# Patient Record
Sex: Male | Born: 1960 | Race: White | Hispanic: No | Marital: Married | Smoking: Never smoker
Health system: Southern US, Community
[De-identification: ages and names within clinical notes are randomized; demographics above are authoritative.]

## PROBLEM LIST (undated history)

## (undated) DIAGNOSIS — F419 Anxiety disorder, unspecified: Secondary | ICD-10-CM

## (undated) DIAGNOSIS — F329 Major depressive disorder, single episode, unspecified: Secondary | ICD-10-CM

## (undated) DIAGNOSIS — J3489 Other specified disorders of nose and nasal sinuses: Secondary | ICD-10-CM

## (undated) DIAGNOSIS — I10 Essential (primary) hypertension: Secondary | ICD-10-CM

## (undated) DIAGNOSIS — K219 Gastro-esophageal reflux disease without esophagitis: Secondary | ICD-10-CM

## (undated) DIAGNOSIS — F32A Depression, unspecified: Secondary | ICD-10-CM

## (undated) DIAGNOSIS — J45909 Unspecified asthma, uncomplicated: Secondary | ICD-10-CM

## (undated) DIAGNOSIS — Z972 Presence of dental prosthetic device (complete) (partial): Secondary | ICD-10-CM

## (undated) HISTORY — PX: CARDIOVASCULAR STRESS TEST: SHX262

## (undated) HISTORY — PX: ESOPHAGEAL DILATION: SHX303

## (undated) HISTORY — PX: TONSILLECTOMY: SUR1361

## (undated) HISTORY — PX: KNEE ARTHROSCOPY: SUR90

## (undated) HISTORY — PX: COLONOSCOPY: SHX174

## (undated) HISTORY — PX: KNEE ARTHROSCOPY: SHX127

## (undated) HISTORY — PX: UPPER GI ENDOSCOPY: SHX6162

---

## 2006-10-12 ENCOUNTER — Ambulatory Visit: Payer: Self-pay | Admitting: Physician Assistant

## 2007-12-04 ENCOUNTER — Ambulatory Visit: Payer: Self-pay

## 2008-03-27 ENCOUNTER — Ambulatory Visit: Payer: Self-pay | Admitting: Unknown Physician Specialty

## 2011-03-25 ENCOUNTER — Emergency Department: Payer: Self-pay | Admitting: Emergency Medicine

## 2012-02-11 ENCOUNTER — Ambulatory Visit: Payer: Self-pay | Admitting: Unknown Physician Specialty

## 2014-03-07 DIAGNOSIS — I10 Essential (primary) hypertension: Secondary | ICD-10-CM | POA: Insufficient documentation

## 2015-03-24 ENCOUNTER — Encounter: Payer: Self-pay | Admitting: *Deleted

## 2015-04-03 NOTE — Discharge Instructions (Signed)
[facility] °[facility] °ENDOSCOPIC SINUS SURGERY °[facility] ° °What is Functional Endoscopic Sinus Surgery? ° The Surgery involves making the natural openings of the sinuses larger by removing the bony partitions that separate the sinuses from the nasal cavity.  The natural sinus lining is preserved as much as possible to allow the sinuses to resume normal function after the surgery.  In some patients nasal polyps (excessively swollen lining of the sinuses) may be removed to relieve obstruction of the sinus openings.  The surgery is performed through the nose using lighted scopes, which eliminates the need for incisions on the face.  A septoplasty is a different procedure which is sometimes performed with sinus surgery.  It involves straightening the boy partition that separates the two sides of your nose.  A crooked or deviated septum may need repair if is obstructing the sinuses or nasal airflow.  Turbinate reduction is also often performed during sinus surgery.  The turbinates are bony proturberances from the side walls of the nose which swell and can obstruct the nose in patients with sinus and allergy problems.  Their size can be surgically reduced to help relieve nasal obstruction. ° °What Can Sinus Surgery Do For Me? ° Sinus surgery can reduce the frequency of sinus infections requiring antibiotic treatment.  This can provide improvement in nasal congestion, post-nasal drainage, facial pressure and nasal obstruction.  Surgery will NOT prevent you from ever having an infection again, so it usually only for patients who get infections 4 or more times yearly requiring antibiotics, or for infections that do not clear with antibiotics.  It will not cure nasal allergies, so patients with allergies may still require medication to treat their allergies after surgery. Surgery may improve headaches related to sinusitis, however, some people will continue to  require medication to control sinus headaches related to allergies.  Surgery will do nothing for other forms of headache (migraine, tension or cluster). ° °What Are the Risks of Endoscopic Sinus Surgery? ° Current techniques allow surgery to be performed safely with little risk, however, there are rare complications that patients should be aware of.  Because the sinuses are located around the eyes, there is risk of eye injury, including blindness, though again, this would be quite rare. This is usually a result of bleeding behind the eye during surgery, which puts the vision oat risk, though there are treatments to protect the vision and prevent permanent disrupted by surgery causing a leak of the spinal fluid that surrounds the brain.  More serious complications would include bleeding inside the brain cavity or damage to the brain.  Again, all of these complications are uncommon, and spinal fluid leaks can be safely managed surgically if they occur.  The most common complication of sinus surgery is bleeding from the nose, which may require packing or cauterization of the nose.  Continued sinus have polyps may experience recurrence of the polyps requiring revision surgery.  Alterations of sense of smell or injury to the tear ducts are also rare complications.  ° °What is the Surgery Like, and what is the Recovery? ° The Surgery usually takes a couple of hours to perform, and is usually performed under a general anesthetic (completely asleep).  Patients are usually discharged home after a couple of hours.  Sometimes during surgery it is necessary to pack the nose to control bleeding, and the packing is left in place for 24 - 48 hours, and removed by your surgeon.    If a septoplasty was performed during the procedure, there is often a splint placed which must be removed after 5-7 days.   °Discomfort: Pain is usually mild to moderate, and can be controlled by prescription pain medication or acetaminophen (Tylenol).   Aspirin, Ibuprofen (Advil, Motrin), or Naprosyn (Aleve) should be avoided, as they can cause increased bleeding.  Most patients feel sinus pressure like they have a bad head cold for several days.  Sleeping with your head elevated can help reduce swelling and facial pressure, as can ice packs over the face.  A humidifier may be helpful to keep the mucous and blood from drying in the nose.  ° °Diet: There are no specific diet restrictions, however, you should generally start with clear liquids and a light diet of bland foods because the anesthetic can cause some nausea.  Advance your diet depending on how your stomach feels.  Taking your pain medication with food will often help reduce stomach upset which pain medications can cause. ° °Nasal Saline Irrigation: It is important to remove blood clots and dried mucous from the nose as it is healing.  This is done by having you irrigate the nose at least 3 - 4 times daily with a salt water solution.  We recommend using NeilMed Sinus Rinse (available at the drug store).  Fill the squeeze bottle with the solution, bend over a sink, and insert the tip of the squeeze bottle into the nose ½ of an inch.  Point the tip of the squeeze bottle towards the inside corner of the eye on the same side your irrigating.  Squeeze the bottle and gently irrigate the nose.  If you bend forward as you do this, most of the fluid will flow back out of the nose, instead of down your throat.   The solution should be warm, near body temperature, when you irrigate.   Each time you irrigate, you should use a full squeeze bottle.  ° °Note that if you are instructed to use Nasal Steroid Sprays at any time after your surgery, irrigate with saline BEFORE using the steroid spray, so you do not wash it all out of the nose. °Another product, Nasal Saline Gel (such as AYR Nasal Saline Gel) can be applied in each nostril 3 - 4 times daily to moisture the nose and reduce scabbing or crusting. ° °Bleeding:   Bloody drainage from the nose can be expected for several days, and patients are instructed to irrigate their nose frequently with salt water to help remove mucous and blood clots.  The drainage may be dark red or brown, though some fresh blood may be seen intermittently, especially after irrigation.  Do not blow you nose, as bleeding may occur. If you must sneeze, keep your mouth open to allow air to escape through your mouth. ° °If heavy bleeding occurs: Irrigate the nose with saline to rinse out clots, then spray the nose 3 - 4 times with Afrin Nasal Decongestant Spray.  The spray will constrict the blood vessels to slow bleeding.  Pinch the lower half of your nose shut to apply pressure, and lay down with your head elevated.  Ice packs over the nose may help as well. If bleeding persists despite these measures, you should notify your doctor.  Do not use the Afrin routinely to control nasal congestion after surgery, as it can result in worsening congestion and may affect healing.  ° ° ° °Activity: Return to work varies among patients. Most patients will be   out of work at least 5 - 7 days to recover.  Patient may return to work after they are off of narcotic pain medication, and feeling well enough to perform the functions of their job.  Patients must avoid heavy lifting (over 10 pounds) or strenuous physical for 2 weeks after surgery, so your employer may need to assign you to light duty, or keep you out of work longer if light duty is not possible.  NOTE: you should not drive, operate dangerous machinery, do any mentally demanding tasks or make any important legal or financial decisions while on narcotic pain medication and recovering from the general anesthetic.  °  °Call Your Doctor Immediately if You Have Any of the Following: °1. Bleeding that you cannot control with the above measures °2. Loss of vision, double vision, bulging of the eye or black eyes. °3. Fever over 101 degrees °4. Neck stiffness with  severe headache, fever, nausea and change in mental state. °You are always encourage to call anytime with concerns, however, please call with requests for pain medication refills during office hours. ° °Office Endoscopy: During follow-up visits your doctor will remove any packing or splints that may have been placed and evaluate and clean your sinuses endoscopically.  Topical anesthetic will be used to make this as comfortable as possible, though you may want to take your pain medication prior to the visit.  How often this will need to be done varies from patient to patient.  After complete recovery from the surgery, you may need follow-up endoscopy from time to time, particularly if there is concern of recurrent infection or nasal polyps. ° °General Anesthesia, Adult, Care After °Refer to this sheet in the next few weeks. These instructions provide you with information on caring for yourself after your procedure. Your health care provider may also give you more specific instructions. Your treatment has been planned according to current medical practices, but problems sometimes occur. Call your health care provider if you have any problems or questions after your procedure. °WHAT TO EXPECT AFTER THE PROCEDURE °After the procedure, it is typical to experience: °· Sleepiness. °· Nausea and vomiting. °HOME CARE INSTRUCTIONS °· For the first 24 hours after general anesthesia: °¨ Have a responsible person with you. °¨ Do not drive a car. If you are alone, do not take public transportation. °¨ Do not drink alcohol. °¨ Do not take medicine that has not been prescribed by your health care provider. °¨ Do not sign important papers or make important decisions. °¨ You may resume a normal diet and activities as directed by your health care provider. °· Change bandages (dressings) as directed. °· If you have questions or problems that seem related to general anesthesia, call the hospital and ask for the anesthetist or  anesthesiologist on call. °SEEK MEDICAL CARE IF: °· You have nausea and vomiting that continue the day after anesthesia. °· You develop a rash. °SEEK IMMEDIATE MEDICAL CARE IF:  °· You have difficulty breathing. °· You have chest pain. °· You have any allergic problems. °  °This information is not intended to replace advice given to you by your health care provider. Make sure you discuss any questions you have with your health care provider. °  °Document Released: 07/26/2000 Document Revised: 05/10/2014 Document Reviewed: 08/18/2011 °Elsevier Interactive Patient Education ©2016 [employer] ° °

## 2015-04-04 ENCOUNTER — Ambulatory Visit
Admission: RE | Admit: 2015-04-04 | Discharge: 2015-04-04 | Disposition: A | Discharge status: Home / Self Care | Payer: Commercial Managed Care - HMO | Source: Ambulatory Visit | Attending: Unknown Physician Specialty | Admitting: Unknown Physician Specialty

## 2015-04-04 ENCOUNTER — Ambulatory Visit: Payer: Commercial Managed Care - HMO | Admitting: Anesthesiology

## 2015-04-04 ENCOUNTER — Encounter
Admission: RE | Disposition: A | Discharge status: Home / Self Care | Payer: Self-pay | Source: Ambulatory Visit | Attending: Unknown Physician Specialty

## 2015-04-04 DIAGNOSIS — J342 Deviated nasal septum: Secondary | ICD-10-CM | POA: Diagnosis present

## 2015-04-04 DIAGNOSIS — J3489 Other specified disorders of nose and nasal sinuses: Secondary | ICD-10-CM | POA: Insufficient documentation

## 2015-04-04 DIAGNOSIS — Z9889 Other specified postprocedural states: Secondary | ICD-10-CM | POA: Insufficient documentation

## 2015-04-04 DIAGNOSIS — Z79899 Other long term (current) drug therapy: Secondary | ICD-10-CM | POA: Diagnosis not present

## 2015-04-04 DIAGNOSIS — Z833 Family history of diabetes mellitus: Secondary | ICD-10-CM | POA: Diagnosis not present

## 2015-04-04 DIAGNOSIS — I10 Essential (primary) hypertension: Secondary | ICD-10-CM | POA: Diagnosis not present

## 2015-04-04 DIAGNOSIS — Z96653 Presence of artificial knee joint, bilateral: Secondary | ICD-10-CM | POA: Insufficient documentation

## 2015-04-04 DIAGNOSIS — Z825 Family history of asthma and other chronic lower respiratory diseases: Secondary | ICD-10-CM | POA: Insufficient documentation

## 2015-04-04 DIAGNOSIS — J343 Hypertrophy of nasal turbinates: Secondary | ICD-10-CM | POA: Insufficient documentation

## 2015-04-04 DIAGNOSIS — N400 Enlarged prostate without lower urinary tract symptoms: Secondary | ICD-10-CM | POA: Diagnosis not present

## 2015-04-04 HISTORY — PX: TURBINATE REDUCTION: SHX6157

## 2015-04-04 HISTORY — DX: Gastro-esophageal reflux disease without esophagitis: K21.9

## 2015-04-04 HISTORY — DX: Essential (primary) hypertension: I10

## 2015-04-04 HISTORY — PX: SEPTOPLASTY: SHX2393

## 2015-04-04 HISTORY — DX: Other specified disorders of nose and nasal sinuses: J34.89

## 2015-04-04 HISTORY — DX: Presence of dental prosthetic device (complete) (partial): Z97.2

## 2015-04-04 SURGERY — SEPTOPLASTY, NOSE
Anesthesia: General | Wound class: Clean Contaminated

## 2015-04-04 MED ORDER — SULFAMETHOXAZOLE-TRIMETHOPRIM 400-80 MG PO TABS: 1.0000 | ORAL_TABLET | Freq: Two times a day (BID) | ORAL | Status: DC

## 2015-04-04 MED ORDER — HYDRALAZINE HCL 20 MG/ML IJ SOLN
10.0000 mg | Freq: Once | INTRAMUSCULAR | Status: AC
  Administered 2015-04-04: 10 mg via INTRAVENOUS

## 2015-04-04 MED ORDER — ACETAMINOPHEN 325 MG PO TABS: 325.0000 mg | ORAL_TABLET | ORAL | Status: DC | PRN

## 2015-04-04 MED ORDER — OXYCODONE HCL 5 MG PO TABS
5.0000 mg | ORAL_TABLET | Freq: Once | ORAL | Status: AC | PRN
  Administered 2015-04-04: 5 mg via ORAL

## 2015-04-04 MED ORDER — GLYCOPYRROLATE 0.2 MG/ML IJ SOLN
INTRAMUSCULAR | Status: DC | PRN
  Administered 2015-04-04: .1 mg via INTRAVENOUS

## 2015-04-04 MED ORDER — LACTATED RINGERS IV SOLN
INTRAVENOUS | Status: DC
  Administered 2015-04-04: 10:00:00 via INTRAVENOUS

## 2015-04-04 MED ORDER — ACETAMINOPHEN 160 MG/5ML PO SOLN: 325.0000 mg | ORAL | Status: DC | PRN

## 2015-04-04 MED ORDER — ONDANSETRON HCL 4 MG/2ML IJ SOLN
INTRAMUSCULAR | Status: DC | PRN
  Administered 2015-04-04: 4 mg via INTRAVENOUS

## 2015-04-04 MED ORDER — DEXAMETHASONE SODIUM PHOSPHATE 4 MG/ML IJ SOLN
INTRAMUSCULAR | Status: DC | PRN
  Administered 2015-04-04: 10 mg via INTRAVENOUS

## 2015-04-04 MED ORDER — LIDOCAINE HCL (CARDIAC) 20 MG/ML IV SOLN
INTRAVENOUS | Status: DC | PRN
  Administered 2015-04-04: 40 mg via INTRAVENOUS

## 2015-04-04 MED ORDER — SUCCINYLCHOLINE CHLORIDE 20 MG/ML IJ SOLN
INTRAMUSCULAR | Status: DC | PRN
  Administered 2015-04-04: 100 mg via INTRAVENOUS

## 2015-04-04 MED ORDER — OXYMETAZOLINE HCL 0.05 % NA SOLN
6.0000 | Freq: Once | NASAL | Status: AC
Start: 2015-04-04 — End: 2015-04-04
  Administered 2015-04-04: 6 via NASAL

## 2015-04-04 MED ORDER — MIDAZOLAM HCL 5 MG/5ML IJ SOLN
INTRAMUSCULAR | Status: DC | PRN
  Administered 2015-04-04: 2 mg via INTRAVENOUS

## 2015-04-04 MED ORDER — PHENYLEPHRINE HCL 0.5 % NA SOLN
NASAL | Status: DC | PRN
  Administered 2015-04-04: 30 mL

## 2015-04-04 MED ORDER — OXYCODONE HCL 5 MG/5ML PO SOLN: 5.0000 mg | Freq: Once | ORAL | Status: AC | PRN

## 2015-04-04 MED ORDER — HYDROCODONE-ACETAMINOPHEN 7.5-325 MG PO TABS: 1.0000 | ORAL_TABLET | Freq: Four times a day (QID) | ORAL | Status: DC | PRN

## 2015-04-04 MED ORDER — FENTANYL CITRATE (PF) 100 MCG/2ML IJ SOLN
INTRAMUSCULAR | Status: DC | PRN
  Administered 2015-04-04: 100 ug via INTRAVENOUS

## 2015-04-04 MED ORDER — ONDANSETRON HCL 4 MG/2ML IJ SOLN: 4.0000 mg | Freq: Once | INTRAMUSCULAR | Status: DC | PRN

## 2015-04-04 MED ORDER — HYDROMORPHONE HCL 1 MG/ML IJ SOLN
0.2500 mg | INTRAMUSCULAR | Status: DC | PRN
Start: 2015-04-04 — End: 2015-04-04
  Administered 2015-04-04: 0.3 mg via INTRAVENOUS
  Administered 2015-04-04: 0.4 mg via INTRAVENOUS
  Administered 2015-04-04: 0.3 mg via INTRAVENOUS

## 2015-04-04 MED ORDER — HYDROCODONE-ACETAMINOPHEN 5-325 MG PO TABS: 1.0000 | ORAL_TABLET | Freq: Four times a day (QID) | ORAL | Status: DC | PRN

## 2015-04-04 MED ORDER — LABETALOL HCL 5 MG/ML IV SOLN
10.0000 mg | INTRAVENOUS | Status: DC | PRN
  Administered 2015-04-04: 10 mg via INTRAVENOUS

## 2015-04-04 MED ORDER — PROPOFOL 10 MG/ML IV BOLUS
INTRAVENOUS | Status: DC | PRN
  Administered 2015-04-04: 180 mg via INTRAVENOUS

## 2015-04-04 MED ORDER — LIDOCAINE-EPINEPHRINE 1 %-1:100000 IJ SOLN
INTRAMUSCULAR | Status: DC | PRN
Start: 2015-04-04 — End: 2015-04-04
  Administered 2015-04-04: 10 mL

## 2015-04-04 SURGICAL SUPPLY — 30 items
BLADE SURG 15 STRL LF DISP TIS (BLADE) IMPLANT
BLADE SURG 15 STRL SS (BLADE)
COAG SUCT 10F 3.5MM HAND CTRL (MISCELLANEOUS) ×3 IMPLANT
DRAPE HEAD BAR (DRAPES) ×3 IMPLANT
DRESSING NASL FOAM PST OP SINU (MISCELLANEOUS) IMPLANT
DRSG NASAL FOAM POST OP SINU (MISCELLANEOUS)
GLOVE BIO SURGEON STRL SZ7.5 (GLOVE) ×6 IMPLANT
HANDLE YANKAUER SUCT BULB TIP (MISCELLANEOUS) ×3 IMPLANT
KIT ROOM TURNOVER OR (KITS) ×3 IMPLANT
NEEDLE HYPO 25GX1X1/2 BEV (NEEDLE) ×3 IMPLANT
NS IRRIG 500ML POUR BTL (IV SOLUTION) ×3 IMPLANT
PACK DRAPE NASAL/ENT (PACKS) ×3 IMPLANT
PAD GROUND ADULT SPLIT (MISCELLANEOUS) ×3 IMPLANT
SOL ANTI-FOG 6CC FOG-OUT (MISCELLANEOUS) ×1 IMPLANT
SOL FOG-OUT ANTI-FOG 6CC (MISCELLANEOUS) ×2
SPLINT NASAL SEPTAL BLV .25 LG (MISCELLANEOUS) IMPLANT
SPLINT NASAL SEPTAL BLV .50 ST (MISCELLANEOUS) ×3 IMPLANT
SPONGE NEURO XRAY DETECT 1X3 (DISPOSABLE) ×3 IMPLANT
STRAP BODY AND KNEE 60X3 (MISCELLANEOUS) ×3 IMPLANT
SUT CHROMIC 3-0 (SUTURE) ×2
SUT CHROMIC 3-0 KS 27XMFL CR (SUTURE) ×1
SUT CHROMIC 5-0 (SUTURE)
SUT CHROMIC 5-0 P2 18XMFL CR (SUTURE)
SUT ETHILON 3-0 KS 30 BLK (SUTURE) ×3 IMPLANT
SUT PLAIN GUT 4-0 (SUTURE) IMPLANT
SUTURE CHRMC 3-0 KS 27XMFL CR (SUTURE) ×1 IMPLANT
SUTURE CHRMC 5-0 P2 18XMF CR (SUTURE) IMPLANT
SYRINGE 10CC LL (SYRINGE) ×3 IMPLANT
TOWEL OR 17X26 4PK STRL BLUE (TOWEL DISPOSABLE) ×3 IMPLANT
WATER STERILE IRR 500ML POUR (IV SOLUTION) ×3 IMPLANT

## 2015-04-04 NOTE — Anesthesia Preprocedure Evaluation (Signed)
Anesthesia Evaluation  Patient identified by MRN, date of birth, ID band Patient awake    Reviewed: Allergy & Precautions, H&P , NPO status , Patient's Chart, lab work & pertinent test results, reviewed documented beta blocker date and time   Airway Mallampati: II  TM Distance: >3 FB Neck ROM: full    Dental no notable dental hx.    Pulmonary neg pulmonary ROS,    Pulmonary exam normal breath sounds clear to auscultation       Cardiovascular Exercise Tolerance: Good hypertension,  Rhythm:regular Rate:Normal  Negative stress test several years ago.  Sxs thought to be stress related.   Neuro/Psych negative neurological ROS  negative psych ROS   GI/Hepatic Neg liver ROS, GERD  ,  Endo/Other  negative endocrine ROS  Renal/GU negative Renal ROS  negative genitourinary   Musculoskeletal   Abdominal   Peds  Hematology negative hematology ROS (+)   Anesthesia Other Findings   Reproductive/Obstetrics negative OB ROS                             Anesthesia Physical Anesthesia Plan  ASA: II  Anesthesia Plan: General   Post-op Pain Management:    Induction: Intravenous  Airway Management Planned: Oral ETT  Additional Equipment:   Intra-op Plan:   Post-operative Plan: Extubation in OR  Informed Consent: I have reviewed the patients History and Physical, chart, labs and discussed the procedure including the risks, benefits and alternatives for the proposed anesthesia with the patient or authorized representative who has indicated his/her understanding and acceptance.   Dental Advisory Given  Plan Discussed with: CRNA  Anesthesia Plan Comments:         Anesthesia Quick Evaluation

## 2015-04-04 NOTE — H&P (Signed)
  H+P  Reviewed and will be scanned in later. No changes noted. 

## 2015-04-04 NOTE — Op Note (Signed)
PREOPERATIVE DIAGNOSIS:  Chronic nasal obstruction.  POSTOPERATIVE DIAGNOSIS:  Chronic nasal obstruction.  SURGEON:  Davina Pokehapman T. Zahid Carneiro, M.D.  NAME OF PROCEDURE:  1. Nasal septoplasty. 2. Submucous resection of inferior turbinates.  OPERATIVE FINDINGS:  Severe nasal septal deformity, hypertrophy of the inferior turbinates.   DESCRIPTION OF THE PROCEDURE:  Jesse Diaz was identified in the holding area and taken to the operating room and placed in the supine position.  After general endotracheal anesthesia was induced, the table was turned 45 degrees and the patient was placed in a semi-Fowler position.  The nose was then topically anesthetized with Lidocaine, cotton pledgets were placed within each nostril. After approximately 5 minutes, this was removed at which time a local anesthetic of 1% Lidocaine 1:100,000 units of Epinephrine was used to inject the inferior turbinates in the nasal septum. A total of 10 ml was used. Examination of the nose showed a severe left nasal septal deformity and tremendous hypertrophied inferior turbinate.  Beginning on the right hand side a hemitransfixion incision was then created on the leading edge of the septum on the right.  A subperichondrial plane was elevated posteriorly on the left and taken back to the perpendicular plate of the ethmoid where subperiosteal plane was elevated posteriorly on the left. A large septal spur was identified on the left hand side impacting on the inferior turbinate.  An inferior rim of cartilage was removed anteriorly with care taken to leave an anterior strut to prevent nasal collapse. With this strut removed the perpendicular plate of the ethmoid was separated from the quadrangular cartilage. The large septal spur was removed.  The septum was then replaced in the midline. Reinspection through each nostril showed excellent reduction of the septal deformity. A left posterior inferior fenestration was then created to allow hematoma  drainage.  With the septoplasty completed, beginning on the left-hand side, a 15 blade was used to incise along the inferior edge of the inferior turbinate. A superior laterally based flap was then elevated. The underlying conchal bone of mucosa was excised using Knight scissors. The flap was then laid back over the turbinate stump and cauterized using suction cautery. In a similar fashion the submucous resection was performed on the right.  With the submucous resection completed bilaterally and no active bleeding, the hemitransfixion incision was then closed using two interrupted 3-0 chromic sutures.  Plastic nasal septal splints were placed within each nostril and affixed to the septum using a 3-0 nylon suture. Stammberger was then used beneath each inferior turbinate for hemostasis.    The patient tolerated the procedure well, was returned to anesthesia, extubated in the operating room, and taken to the recovery room in stable condition.    CULTURES:  None.  SPECIMENS:  None.  ESTIMATED BLOOD LOSS:  25 cc.  [name] T  04/04/2015  11:31 AM

## 2015-04-04 NOTE — Transfer of Care (Signed)
Immediate Anesthesia Transfer of Care Note  Patient: Jesse Diaz  Procedure(s) Performed: Procedure(s): SEPTOPLASTY (N/A) TURBINATE REDUCTION SMR (N/A)  Patient Location: PACU  Anesthesia Type: General  Level of Consciousness: awake, alert  and patient cooperative  Airway and Oxygen Therapy: Patient Spontanous Breathing and Patient connected to supplemental oxygen  Post-op Assessment: Post-op Vital signs reviewed, Patient's Cardiovascular Status Stable, Respiratory Function Stable, Patent Airway and No signs of Nausea or vomiting  Post-op Vital Signs: Reviewed and stable  Complications: No apparent anesthesia complications

## 2015-04-04 NOTE — Anesthesia Procedure Notes (Signed)
Procedure Name: Intubation Date/Time: 04/04/2015 10:56 AM Performed by: Jimmy PicketAMYOT, Keria Widrig Pre-anesthesia Checklist: Patient identified, Emergency Drugs available, Suction available, Patient being monitored and Timeout performed Patient Re-evaluated:Patient Re-evaluated prior to inductionOxygen Delivery Method: Circle system utilized Preoxygenation: Pre-oxygenation with 100% oxygen Intubation Type: IV induction Ventilation: Mask ventilation without difficulty Laryngoscope Size: Miller and 2 Grade View: Grade I Tube type: Oral Rae Tube size: 7.5 mm Number of attempts: 1 Placement Confirmation: ETT inserted through vocal cords under direct vision,  positive ETCO2 and breath sounds checked- equal and bilateral Tube secured with: Tape Dental Injury: Teeth and Oropharynx as per pre-operative assessment

## 2015-04-04 NOTE — Anesthesia Postprocedure Evaluation (Signed)
Anesthesia Post Note  Patient: Jesse Diaz  Procedure(s) Performed: Procedure(s) (LRB): SEPTOPLASTY (N/A) TURBINATE REDUCTION SMR (N/A)  Patient location during evaluation: PACU Anesthesia Type: General Level of consciousness: awake and alert Pain management: pain level controlled Vital Signs Assessment: post-procedure vital signs reviewed and stable Respiratory status: spontaneous breathing, nonlabored ventilation and respiratory function stable Cardiovascular status: blood pressure returned to baseline and stable Postop Assessment: no signs of nausea or vomiting Anesthetic complications: no    Alta CorningBacon, Mikailah Morel S

## 2015-04-07 ENCOUNTER — Encounter: Payer: Self-pay | Admitting: Unknown Physician Specialty

## 2015-05-10 ENCOUNTER — Emergency Department (HOSPITAL_COMMUNITY): Payer: Commercial Managed Care - HMO

## 2015-05-10 ENCOUNTER — Emergency Department (HOSPITAL_COMMUNITY)
Admission: EM | Admit: 2015-05-10 | Discharge: 2015-05-10 | Disposition: A | Discharge status: Home / Self Care | Payer: Commercial Managed Care - HMO | Attending: Emergency Medicine | Admitting: Emergency Medicine

## 2015-05-10 ENCOUNTER — Encounter (HOSPITAL_COMMUNITY): Payer: Self-pay

## 2015-05-10 DIAGNOSIS — I10 Essential (primary) hypertension: Secondary | ICD-10-CM | POA: Insufficient documentation

## 2015-05-10 DIAGNOSIS — Z98811 Dental restoration status: Secondary | ICD-10-CM | POA: Diagnosis not present

## 2015-05-10 DIAGNOSIS — Z8709 Personal history of other diseases of the respiratory system: Secondary | ICD-10-CM | POA: Insufficient documentation

## 2015-05-10 DIAGNOSIS — Z79899 Other long term (current) drug therapy: Secondary | ICD-10-CM | POA: Diagnosis not present

## 2015-05-10 DIAGNOSIS — Z8719 Personal history of other diseases of the digestive system: Secondary | ICD-10-CM | POA: Diagnosis not present

## 2015-05-10 DIAGNOSIS — R55 Syncope and collapse: Secondary | ICD-10-CM | POA: Diagnosis not present

## 2015-05-10 LAB — I-STAT CHEM 8, ED
BUN: 15 mg/dL (ref 6–20)
CALCIUM ION: 1.13 mmol/L (ref 1.12–1.23)
Chloride: 102 mmol/L (ref 101–111)
Creatinine, Ser: 0.8 mg/dL (ref 0.61–1.24)
Glucose, Bld: 126 mg/dL — ABNORMAL HIGH (ref 65–99)
HCT: 49 % (ref 39.0–52.0)
Hemoglobin: 16.7 g/dL (ref 13.0–17.0)
Potassium: 3.6 mmol/L (ref 3.5–5.1)
SODIUM: 140 mmol/L (ref 135–145)
TCO2: 25 mmol/L (ref 0–100)

## 2015-05-10 LAB — CBG MONITORING, ED: Glucose-Capillary: 124 mg/dL — ABNORMAL HIGH (ref 65–99)

## 2015-05-10 LAB — I-STAT TROPONIN, ED: Troponin i, poc: 0.01 ng/mL (ref 0.00–0.08)

## 2015-05-10 MED ORDER — SODIUM CHLORIDE 0.9 % IV BOLUS (SEPSIS)
1000.0000 mL | Freq: Once | INTRAVENOUS | Status: AC
Start: 2015-05-10 — End: 2015-05-10
  Administered 2015-05-10: 1000 mL via INTRAVENOUS

## 2015-05-10 NOTE — ED Notes (Signed)
Pt. Ambulated to bathroom with ease. No complaints of dizziness/lightheadedness or pain.

## 2015-05-10 NOTE — ED Provider Notes (Signed)
CSN: [id number]     Arrival date & time 05/10/15  [id number] History   First MD Initiated Contact with Patient 05/10/15 [phone]     Chief Complaint  Patient presents with  . Loss of Consciousness     HPI Patient was upstairs visiting a family member who is in the hospital when he started to feel hot and dizzy.  He then had a syncopal episode and fell over.  Did not hit his head.  Shortly after he became recumbent he awoke and was alert and talking to his wife.  Has history of hypertension.  Is back to baseline. Past Medical History  Diagnosis Date  . GERD (gastroesophageal reflux disease)     in past  . Wears dentures     partial lower  . Hypertension   . Sinus drainage    Past Surgical History  Procedure Laterality Date  . Cardiovascular stress test      approx 8 yrs ago - anxiety/reflux  . Tonsillectomy    . Knee arthroscopy Bilateral   . Septoplasty N/A 04/04/2015    Procedure: SEPTOPLASTY;  Surgeon: Linus Salmonshapman McQueen, MD;  [facility];  Service: ENT;  Laterality: N/A;  . Turbinate reduction N/A 04/04/2015    Procedure: TURBINATE REDUCTION SMR;  Surgeon: Linus Salmonshapman McQueen, MD;  [facility];  Service: ENT;  Laterality: N/A;   No family history on file. Social History  Substance Use Topics  . Smoking status: Never Smoker   . Smokeless tobacco: Former NeurosurgeonUser     Comment: quit chew - approx 2008  . Alcohol Use: No    Review of Systems  Neurological: Positive for syncope. Negative for speech difficulty, numbness and headaches.   All other systems reviewed and are negative   Allergies  Review of patient's allergies indicates no known allergies.  Home Medications   Prior to Admission medications   Medication Sig Start Date End Date Taking? Authorizing Provider  BIOTIN PO Take by mouth.   Yes Historical Provider, MD  losartan-hydrochlorothiazide (HYZAAR) 50-12.5 MG tablet Take 1 tablet by mouth daily.   Yes Historical Provider, MD  Multiple Vitamin  (MULTIVITAMIN) tablet Take 1 tablet by mouth daily.   Yes Historical Provider, MD  OVER THE COUNTER MEDICATION [place] diet supplement   Yes Historical Provider, MD  tamsulosin (FLOMAX) 0.4 MG CAPS capsule Take 0.4 mg by mouth every evening.   Yes Historical Provider, MD  HYDROcodone-acetaminophen (NORCO) 5-325 MG tablet Take 1-2 tablets by mouth every 6 (six) hours as needed for moderate pain. Patient not taking: Reported on 05/10/2015 04/04/15   Linus Salmonshapman McQueen, MD  HYDROcodone-acetaminophen [facility](NORCO) 7.5-325 MG tablet Take 1 tablet by mouth every 6 (six) hours as needed for moderate pain. Patient not taking: Reported on 05/10/2015 04/04/15   Linus Salmonshapman McQueen, MD  sulfamethoxazole-trimethoprim (BACTRIM) 400-80 MG tablet Take 1 tablet by mouth 2 (two) times daily. Patient not taking: Reported on 05/10/2015 04/04/15   Linus Salmonshapman McQueen, MD  sulfamethoxazole-trimethoprim (BACTRIM) 400-80 MG tablet Take 1 tablet by mouth 2 (two) times daily. Patient not taking: Reported on 05/10/2015 04/04/15   Linus Salmonshapman McQueen, MD   BP 111/69 mmHg  Pulse 62  Temp(Src) 97.7 F (36.5 C) (Oral)  Resp 12  Ht 5\' 9"  (1.753 m)  Wt 210 lb (95.255 kg)  BMI 31.00 kg/m2  SpO2 98% Physical Exam Physical Exam  Nursing note and vitals reviewed. Constitutional: He is oriented to person, place, and time. He appears well-developed and well-nourished. No distress.  HENT:  Head: Normocephalic and atraumatic.  Eyes: Pupils are equal, round, and reactive to light.  Neck: Normal range of motion.  Supple with no meningeal irritation.   Cardiovascular: Normal rate and intact distal pulses.   Pulmonary/Chest: No respiratory distress.  Abdominal: Normal appearance. He exhibits no distension.  Musculoskeletal: Normal range of motion.  Neurological: He is alert and oriented to person, place, and time. No cranial nerve deficit.  No lateralizing or focal motor deficits.  No known weakness.  No sensory loss.   Skin: Skin is warm and dry. No rash  noted.    ED Course  Procedures (including critical care time) Labs Review Labs Reviewed  CBG MONITORING, ED - Abnormal; Notable for the following:    Glucose-Capillary 124 (*)    All other components within normal limits  I-STAT CHEM 8, ED - Abnormal; Notable for the following:    Glucose, Bld 126 (*)    All other components within normal limits  I-STAT TROPOININ, ED    Imaging Review Dg Chest 2 View  05/10/2015  CLINICAL DATA:  Syncope EXAM: CHEST  2 VIEW COMPARISON:  None FINDINGS: Normal heart size.  Clear lungs.  No pneumothorax. IMPRESSION: No active cardiopulmonary disease. Electronically Signed   By: Jolaine Click M.D.   On: 05/10/2015 09:30   Ct Head Wo Contrast  05/10/2015  CLINICAL DATA:  Syncope EXAM: CT HEAD WITHOUT CONTRAST TECHNIQUE: Contiguous axial images were obtained from the base of the skull through the vertex without intravenous contrast. COMPARISON:  None. FINDINGS: No mass effect, midline shift, or acute hemorrhage. Mastoid air cells are clear. Mucosal thickening in the maxillary sinuses. No skull fracture. IMPRESSION: No acute intracranial pathology. Electronically Signed   By: Jolaine Click M.D.   On: 05/10/2015 09:18   I have personally reviewed and evaluated these images and lab results as part of my medical decision-making.   EKG Interpretation   Date/Time:  Saturday May 10 2015 08:37:33 EST Ventricular Rate:  66 PR Interval:  173 QRS Duration: 97 QT Interval:  406 QTC Calculation: 425 R Axis:   96 Text Interpretation:  Sinus rhythm Borderline right axis deviation  Otherwise normal ECG Confirmed by Cris Gibby  MD, Ellis Koffler ([id number]) on 05/10/2015  8:40:14 AM     After treatment in the ED the patient feels back to baseline and wants to go home. MDM   Final diagnoses:  Vasovagal syncope        Nelva Nay, MD 05/10/15 1106

## 2015-05-10 NOTE — ED Notes (Signed)
Pt. Was upstairs with family member when he started to feel hot and uneasy. Pt. Wife states she witnessed him have a syncopal episode. Pt. Did not fall or hit head. Pt. AxO x4.

## 2015-05-10 NOTE — Discharge Instructions (Signed)
Syncope, commonly known as fainting, is a temporary loss of consciousness. It occurs when the blood flow to the brain is reduced. Vasovagal syncope (also called neurocardiogenic syncope) is a fainting spell in which the blood flow to the brain is reduced because of a sudden drop in heart rate and blood pressure. Vasovagal syncope occurs when the brain and the cardiovascular system (blood vessels) do not adequately communicate and respond to each other. This is the most common cause of fainting. It often occurs in response to fear or some other type of emotional or physical stress. The body has a reaction in which the heart starts beating too slowly or the blood vessels expand, reducing blood pressure. This type of fainting spell is generally considered harmless. However, injuries can occur if a person takes a sudden fall during a fainting spell.   CAUSES   Vasovagal syncope occurs when a person's blood pressure and heart rate decrease suddenly, usually in response to a trigger. Many things and situations can trigger an episode. Some of these include:    Pain.    Fear.    The sight of blood or medical procedures, such as blood being drawn from a vein.    Common activities, such as coughing, swallowing, stretching, or going to the bathroom.    Emotional stress.    Prolonged standing, especially in a warm environment.    Lack of sleep or rest.    Prolonged lack of food.    Prolonged lack of fluids.    Recent illness.   The use of certain drugs that affect blood pressure, such as cocaine, alcohol, marijuana, inhalants, and opiates.   SYMPTOMS   Before the fainting episode, you may:    Feel dizzy or light headed.    Become pale.   Sense that you are going to faint.    Feel like the room is spinning.    Have tunnel vision, only seeing directly in front of you.    Feel sick to your stomach (nauseous).    See spots or slowly lose vision.    Hear ringing in your ears.    Have a headache.     Feel warm and sweaty.    Feel a sensation of pins and needles.  During the fainting spell, you will generally be unconscious for no longer than a couple minutes before waking up and returning to normal. If you get up too quickly before your body can recover, you may faint again. Some twitching or jerky movements may occur during the fainting spell.   DIAGNOSIS   Your health care provider will ask about your symptoms, take a medical history, and perform a physical exam. Various tests may be done to rule out other causes of fainting. These may include blood tests and tests to check the heart, such as electrocardiography, echocardiography, and possibly an electrophysiology study. When other causes have been ruled out, a test may be done to check the body's response to changes in position (tilt table test).  TREATMENT   Most cases of vasovagal syncope do not require treatment. Your health care provider may recommend ways to avoid fainting triggers and may provide home strategies for preventing fainting. If you must be exposed to a possible trigger, you can drink additional fluids to help reduce your chances of having an episode of vasovagal syncope. If you have warning signs of an oncoming episode, you can respond by positioning yourself favorably (lying down).  If your fainting spells continue, you may be   given medicines to prevent fainting. Some medicines may help make you more resistant to repeated episodes of vasovagal syncope. Special exercises or compression stockings may be recommended. In rare cases, the surgical placement of a pacemaker is considered.  HOME CARE INSTRUCTIONS    Learn to identify the warning signs of vasovagal syncope.    Sit or lie down at the first warning sign of a fainting spell. If sitting, put your head down between your legs. If you lie down, swing your legs up in the air to increase blood flow to the brain.    Avoid hot tubs and saunas.   Avoid prolonged standing.   Drink  enough fluids to keep your urine clear or pale yellow. Avoid caffeine.   Increase salt in your diet as directed by your health care provider.    If you have to stand for a long time, perform movements such as:     Crossing your legs.     Flexing and stretching your leg muscles.     Squatting.     Moving your legs.     Bending over.    Only take over-the-counter or prescription medicines as directed by your health care provider. Do not suddenly stop any medicines without asking your health care provider first.  SEEK MEDICAL CARE IF:    Your fainting spells continue or happen more frequently in spite of treatment.    You lose consciousness for more than a couple minutes.   You have fainting spells during or after exercising or after being startled.    You have new symptoms that occur with the fainting spells, such as:     Shortness of breath.    Chest pain.     Irregular heartbeat.    You have episodes of twitching or jerky movements that last longer than a few seconds.   You have episodes of twitching or jerky movements without obvious fainting.  SEEK IMMEDIATE MEDICAL CARE IF:    You have injuries or bleeding after a fainting spell.    You have episodes of twitching or jerky movements that last longer than 5 minutes.    You have more than one spell of twitching or jerky movements before returning to consciousness after fainting.     This information is not intended to replace advice given to you by your health care provider. Make sure you discuss any questions you have with your health care provider.     Document Released: 04/05/2012 Document Revised: 09/03/2014 Document Reviewed: 04/05/2012  [employer] Education 2016 [employer]

## 2015-12-25 ENCOUNTER — Encounter
Admission: RE | Disposition: A | Discharge status: Home / Self Care | Payer: Self-pay | Source: Ambulatory Visit | Attending: Urology

## 2015-12-25 ENCOUNTER — Ambulatory Visit
Admission: RE | Admit: 2015-12-25 | Discharge: 2015-12-25 | Disposition: A | Discharge status: Home / Self Care | Payer: Commercial Managed Care - HMO | Source: Ambulatory Visit | Attending: Urology | Admitting: Urology

## 2015-12-25 ENCOUNTER — Encounter: Payer: Self-pay | Admitting: *Deleted

## 2015-12-25 DIAGNOSIS — Z79899 Other long term (current) drug therapy: Secondary | ICD-10-CM | POA: Diagnosis not present

## 2015-12-25 DIAGNOSIS — J45909 Unspecified asthma, uncomplicated: Secondary | ICD-10-CM | POA: Insufficient documentation

## 2015-12-25 DIAGNOSIS — N200 Calculus of kidney: Secondary | ICD-10-CM | POA: Diagnosis present

## 2015-12-25 DIAGNOSIS — Z9889 Other specified postprocedural states: Secondary | ICD-10-CM | POA: Insufficient documentation

## 2015-12-25 DIAGNOSIS — I10 Essential (primary) hypertension: Secondary | ICD-10-CM | POA: Insufficient documentation

## 2015-12-25 HISTORY — PX: EXTRACORPOREAL SHOCK WAVE LITHOTRIPSY: SHX1557

## 2015-12-25 SURGERY — LITHOTRIPSY, ESWL
Anesthesia: Moderate Sedation | Laterality: Right

## 2015-12-25 MED ORDER — LEVOFLOXACIN 500 MG PO TABS
500.0000 mg | ORAL_TABLET | Freq: Once | ORAL | Status: AC
  Administered 2015-12-25: 500 mg via ORAL

## 2015-12-25 MED ORDER — LEVOFLOXACIN 500 MG PO TABS: 500.0000 mg | ORAL_TABLET | Freq: Every day | ORAL | 0 refills | Status: DC

## 2015-12-25 MED ORDER — MIDAZOLAM HCL 2 MG/2ML IJ SOLN
INTRAMUSCULAR | Status: AC
  Administered 2015-12-25: 1 mg via INTRAMUSCULAR
  Filled 2015-12-25: qty 2

## 2015-12-25 MED ORDER — TAMSULOSIN HCL 0.4 MG PO CAPS: 0.4000 mg | ORAL_CAPSULE | Freq: Every day | ORAL | 11 refills | Status: DC

## 2015-12-25 MED ORDER — NUCYNTA 50 MG PO TABS: 50.0000 mg | ORAL_TABLET | Freq: Four times a day (QID) | ORAL | 0 refills | Status: DC | PRN

## 2015-12-25 MED ORDER — PROMETHAZINE HCL 25 MG/ML IJ SOLN
25.0000 mg | Freq: Once | INTRAMUSCULAR | Status: AC
  Administered 2015-12-25: 25 mg via INTRAMUSCULAR

## 2015-12-25 MED ORDER — FUROSEMIDE 10 MG/ML IJ SOLN
10.0000 mg | Freq: Once | INTRAMUSCULAR | Status: AC
  Administered 2015-12-25: 10 mg via INTRAVENOUS

## 2015-12-25 MED ORDER — DIPHENHYDRAMINE HCL 25 MG PO CAPS
ORAL_CAPSULE | ORAL | Status: AC
  Administered 2015-12-25: 25 mg via ORAL
  Filled 2015-12-25: qty 1

## 2015-12-25 MED ORDER — MORPHINE SULFATE (PF) 10 MG/ML IV SOLN
10.0000 mg | Freq: Once | INTRAVENOUS | Status: AC
  Administered 2015-12-25: 10 mg via INTRAMUSCULAR

## 2015-12-25 MED ORDER — ONDANSETRON 8 MG PO TBDP: 8.0000 mg | ORAL_TABLET | Freq: Four times a day (QID) | ORAL | 3 refills | Status: DC | PRN

## 2015-12-25 MED ORDER — MORPHINE SULFATE (PF) 10 MG/ML IV SOLN
INTRAVENOUS | Status: AC
  Administered 2015-12-25: 10 mg via INTRAMUSCULAR
  Filled 2015-12-25: qty 1

## 2015-12-25 MED ORDER — FUROSEMIDE 10 MG/ML IJ SOLN
INTRAMUSCULAR | Status: DC
Start: 2015-12-25 — End: 2015-12-25
  Filled 2015-12-25: qty 2

## 2015-12-25 MED ORDER — PROMETHAZINE HCL 25 MG/ML IJ SOLN
INTRAMUSCULAR | Status: AC
  Administered 2015-12-25: 25 mg via INTRAMUSCULAR
  Filled 2015-12-25: qty 1

## 2015-12-25 MED ORDER — DEXTROSE-NACL 5-0.45 % IV SOLN
INTRAVENOUS | Status: DC
  Administered 2015-12-25: 07:00:00 via INTRAVENOUS

## 2015-12-25 MED ORDER — MIDAZOLAM HCL 2 MG/2ML IJ SOLN
1.0000 mg | Freq: Once | INTRAMUSCULAR | Status: AC
  Administered 2015-12-25: 1 mg via INTRAMUSCULAR

## 2015-12-25 MED ORDER — DIPHENHYDRAMINE HCL 25 MG PO CAPS
25.0000 mg | ORAL_CAPSULE | Freq: Once | ORAL | Status: AC
  Administered 2015-12-25: 25 mg via ORAL

## 2015-12-25 MED ORDER — LEVOFLOXACIN 500 MG PO TABS
ORAL_TABLET | ORAL | Status: AC
  Administered 2015-12-25: 500 mg via ORAL
  Filled 2015-12-25: qty 1

## 2015-12-25 NOTE — Discharge Instructions (Addendum)
Dietary Guidelines to Help Prevent Kidney Stones Your risk of kidney stones can be decreased by adjusting the foods you eat. The most important thing you can do is drink enough fluid. You should drink enough fluid to keep your urine clear or pale yellow. The following guidelines provide specific information for the type of kidney stone you have had. GUIDELINES ACCORDING TO TYPE OF KIDNEY STONE Calcium Oxalate Kidney Stones  Reduce the amount of salt you eat. Foods that have a lot of salt cause your body to release excess calcium into your urine. The excess calcium can combine with a substance called oxalate to form kidney stones.  Reduce the amount of animal protein you eat if the amount you eat is excessive. Animal protein causes your body to release excess calcium into your urine. Ask your dietitian how much protein from animal sources you should be eating.  Avoid foods that are high in oxalates. If you take vitamins, they should have less than 500 mg of vitamin C. Your body turns vitamin C into oxalates. You do not need to avoid fruits and vegetables high in vitamin C. Calcium Phosphate Kidney Stones  Reduce the amount of salt you eat to help prevent the release of excess calcium into your urine.  Reduce the amount of animal protein you eat if the amount you eat is excessive. Animal protein causes your body to release excess calcium into your urine. Ask your dietitian how much protein from animal sources you should be eating.  Get enough calcium from food or take a calcium supplement (ask your dietitian for recommendations). Food sources of calcium that do not increase your risk of kidney stones include:  Broccoli.  Dairy products, such as cheese and yogurt.  Pudding. Uric Acid Kidney Stones  Do not have more than 6 oz of animal protein per day. FOOD SOURCES Animal Protein Sources  Meat (all types).  Poultry.  Eggs.  Fish, seafood. Foods High in Salt  Salt seasonings.  Soy  sauce.  Teriyaki sauce.  Cured and processed meats.  Salted crackers and snack foods.  Fast food.  Canned soups and most canned foods. Foods High in Oxalates  Grains:  Amaranth.  Barley.  Grits.  Wheat germ.  Bran.  Buckwheat flour.  All bran cereals.  Pretzels.  Whole wheat bread.  Vegetables:  Beans (wax).  Beets and beet greens.  Collard greens.  Eggplant.  Escarole.  Leeks.  Okra.  Parsley.  Rutabagas.  Spinach.  Swiss chard.  Tomato paste.  Fried potatoes.  Sweet potatoes.  Fruits:  Red currants.  Figs.  Kiwi.  Rhubarb.  Meat and Other Protein Sources:  Beans (dried).  Soy burgers and other soybean products.  Miso.  Nuts (peanuts, almonds, pecans, cashews, hazelnuts).  Nut butters.  Sesame seeds and tahini (paste made of sesame seeds).  Poppy seeds.  Beverages:  Chocolate drink mixes.  Soy milk.  Instant iced tea.  Juices made from high-oxalate fruits or vegetables.  Other:  Carob.  Chocolate.  Fruitcake.  Marmalades.   This information is not intended to replace advice given to you by your health care provider. Make sure you discuss any questions you have with your health care provider.   Document Released: 08/14/2010 Document Revised: 04/24/2013 Document Reviewed: 03/16/2013 Elsevier Interactive Patient Education 2016 Else[employer]  Kidney Stones Kidney stones (urolithiasis) are deposits that form inside your kidneys. The intense pain is caused by the stone moving through the urinary tract. When the stone moves, the ureter   goes into spasm around the stone. The stone is usually passed in the urine.  CAUSES   A disorder that makes certain neck glands produce too much parathyroid hormone (primary hyperparathyroidism).  A buildup of uric acid crystals, similar to gout in your joints.  Narrowing (stricture) of the ureter.  A kidney obstruction present at birth (congenital  obstruction).  Previous surgery on the kidney or ureters.  Numerous kidney infections. SYMPTOMS   Feeling sick to your stomach (nauseous).  Throwing up (vomiting).  Blood in the urine (hematuria).  Pain that usually spreads (radiates) to the groin.  Frequency or urgency of urination. DIAGNOSIS   Taking a history and physical exam.  Blood or urine tests.  CT scan.  Occasionally, an examination of the inside of the urinary bladder (cystoscopy) is performed. TREATMENT   Observation.  Increasing your fluid intake.  Extracorporeal shock wave lithotripsy--This is a noninvasive procedure that uses shock waves to break up kidney stones.  Surgery may be needed if you have severe pain or persistent obstruction. There are various surgical procedures. Most of the procedures are performed with the use of small instruments. Only small incisions are needed to accommodate these instruments, so recovery time is minimized. The size, location, and chemical composition are all important variables that will determine the proper choice of action for you. Talk to your health care provider to better understand your situation so that you will minimize the risk of injury to yourself and your kidney.  HOME CARE INSTRUCTIONS   Drink enough water and fluids to keep your urine clear or pale yellow. This will help you to pass the stone or stone fragments.  Strain all urine through the provided strainer. Keep all particulate matter and stones for your health care provider to see. The stone causing the pain may be as small as a grain of salt. It is very important to use the strainer each and every time you pass your urine. The collection of your stone will allow your health care provider to analyze it and verify that a stone has actually passed. The stone analysis will often identify what you can do to reduce the incidence of recurrences.  Only take over-the-counter or prescription medicines for pain,  discomfort, or fever as directed by your health care provider.  Keep all follow-up visits as told by your health care provider. This is important.  Get follow-up X-rays if required. The absence of pain does not always mean that the stone has passed. It may have only stopped moving. If the urine remains completely obstructed, it can cause loss of kidney function or even complete destruction of the kidney. It is your responsibility to make sure X-rays and follow-ups are completed. Ultrasounds of the kidney can show blockages and the status of the kidney. Ultrasounds are not associated with any radiation and can be performed easily in a matter of minutes.  Make changes to your daily diet as told by your health care provider. You may be told to:  Limit the amount of salt that you eat.  Eat 5 or more servings of fruits and vegetables each day.  Limit the amount of meat, poultry, fish, and eggs that you eat.  Collect a 24-hour urine sample as told by your health care provider.You may need to collect another urine sample every 6-12 months. SEEK MEDICAL CARE IF:  You experience pain that is progressive and unresponsive to any pain medicine you have been prescribed. SEEK IMMEDIATE MEDICAL CARE IF:   Pain   cannot be controlled with the prescribed medicine.  You have a fever or shaking chills.  The severity or intensity of pain increases over 18 hours and is not relieved by pain medicine.  You develop a new onset of abdominal pain.  You feel faint or pass out.  You are unable to urinate.   This information is not intended to replace advice given to you by your health care provider. Make sure you discuss any questions you have with your health care provider.   Document Released: 04/19/2005 Document Revised: 01/08/2015 Document Reviewed: 09/20/2012 Elsevier Interactive Patient Education 2016 [employer] Inc[employer]  Lithotripsy, Care After Refer to this sheet in the next few weeks. These instructions  provide you with information on caring for yourself after your procedure. Your health care provider may also give you more specific instructions. Your treatment has been planned according to current medical practices, but problems sometimes occur. Call your health care provider if you have any problems or questions after your procedure. WHAT TO EXPECT AFTER THE PROCEDURE   Your urine may have a red tinge for a few days after treatment. Blood loss is usually minimal.  You may have soreness in the back or flank area. This usually goes away after a few days. The procedure can cause blotches or bruises on the back where the pressure wave enters the skin. These marks usually cause only minimal discomfort and should disappear in a short time.  Stone fragments should begin to pass within 24 hours of treatment. However, a delayed passage is not unusual.  You may have pain, discomfort, and feel sick to your stomach (nauseated) when the crushed fragments of stone are passed down the tube from the kidney to the bladder. Stone fragments can pass soon after the procedure and may last for up to 4-8 weeks.  A small number of patients may have severe pain when stone fragments are not able to pass, which leads to an obstruction.  If your stone is greater than 1 inch (2.5 cm) in diameter or if you have multiple stones that have a combined diameter greater than 1 inch (2.5 cm), you may require more than one treatment.  If you had a stent placed prior to your procedure, you may experience some discomfort, especially during urination. You may experience the pain or discomfort in your flank or back, or you may experience a sharp pain or discomfort at the base of your penis or in your lower abdomen. The discomfort usually lasts only a few minutes after urinating. HOME CARE INSTRUCTIONS   Rest at home until you feel your energy improving.  Only take over-the-counter or prescription medicines for pain, discomfort, or  fever as directed by your health care provider. Depending on the type of lithotripsy, you may need to take antibiotics and anti-inflammatory medicines for a few days.  Drink enough water and fluids to keep your urine clear or pale yellow. This helps "flush" your kidneys. It helps pass any remaining pieces of stone and prevents stones from coming back.  Most people can resume daily activities within 1-2 days after standard lithotripsy. It can take longer to recover from laser and percutaneous lithotripsy.  Strain all urine through the provided strainer. Keep all particulate matter and stones for your health care provider to see. The stone may be as small as a grain of salt. It is very important to use the strainer each and every time you pass your urine. Any stones that are found can be sent to   a medical lab for examination.  Visit your health care provider for a follow-up appointment in a few weeks. Your doctor may remove your stent if you have one. Your health care provider will also check to see whether stone particles still remain. SEEK MEDICAL CARE IF:   Your pain is not relieved by medicine.  You have a lasting nauseous feeling.  You feel there is too much blood in the urine.  You develop persistent problems with frequent or painful urination that does not at least partially improve after 2 days following the procedure.  You have a congested cough.  You feel lightheaded.  You develop a rash or any other signs that might suggest an allergic problem.  You develop any reaction or side effects to your medicine(s). SEEK IMMEDIATE MEDICAL CARE IF:   You experience severe back or flank pain or both.  You see nothing but blood when you urinate.  You cannot pass any urine at all.  You have a fever or shaking chills.  You develop shortness of breath, difficulty breathing, or chest pain.  You develop vomiting that will not stop after 6-8 hours.  You have a fainting episode.   This  information is not intended to replace advice given to you by your health care provider. Make sure you discuss any questions you have with your health care provider.   Document Released: 05/09/2007 Document Revised: 01/08/2015 Document Reviewed: 11/02/2012 Elsevier Interactive Patient Education 2016 [employer] Inc.  Lithotripsy Lithotripsy is a treatment that can sometimes help eliminate kidney stones and pain that they cause. A form of lithotripsy, also known as extracorporeal shock wave lithotripsy, is a nonsurgical procedure that helps your body rid itself of the kidney stone when it is too big to pass on its own. Extracorporeal shock wave lithotripsy is a method of crushing a kidney stone with shock waves. These shock waves pass through your body and are focused on your stone. They cause the kidney stones to crumble while still in the urinary tract. It is then easier for the smaller pieces of stone to pass in the urine. Lithotripsy usually takes about an hour. It is done in a hospital, a lithotripsy center, or a mobile unit. It usually does not require an overnight stay. Your health care provider will instruct you on preparation for the procedure. Your health care provider will tell you what to expect afterward. LET YOUR HEALTH CARE PROVIDER KNOW ABOUT:  Any allergies you have.  All medicines you are taking, including vitamins, herbs, eye drops, creams, and over-the-counter medicines.  Previous problems you or members of your family have had with the use of anesthetics.  Any blood disorders you have.  Previous surgeries you have had.  Medical conditions you have. RISKS AND COMPLICATIONS Generally, lithotripsy for kidney stones is a safe procedure. However, as with any procedure, complications can occur. Possible complications include:  Infection.  Bleeding of the kidney.  Bruising of the kidney or skin.  Obstruction of the ureter.  Failure of the stone to fragment. BEFORE THE  PROCEDURE  Do not eat or drink for 6-8 hours prior to the procedure. You may, however, take the medications with a sip of water that your physician instructs you to take  Do not take aspirin or aspirin-containing products for 7 days prior to your procedure  Do not take nonsteroidal anti-inflammatory products for 7 days prior to your procedure PROCEDURE A stent (flexible tube with holes) may be placed in your ureter. The ureter is   the tube that transports the urine from the kidneys to the bladder. Your health care provider may place a stent before the procedure. This will help keep urine flowing from the kidney if the fragments of the stone block the ureter. You may have an IV tube placed in one of your veins to give you fluids and medicines. These medicines may help you relax or make you sleep. During the procedure, you will lie comfortably on a fluid-filled cushion or in a warm-water bath. After an X-ray or ultrasound exam to locate your stone, shock waves are aimed at the stone. If you are awake, you may feel a tapping sensation as the shock waves pass through your body. If large stone particles remain after treatment, a second procedure may be necessary at a later date. For comfort during the test:  Relax as much as possible.  Try to remain still as much as possible.  Try to follow instructions to speed up the test.  Let your health care provider know if you are uncomfortable, anxious, or in pain. AFTER THE PROCEDURE  After surgery, you will be taken to the recovery area. A nurse will watch and check your progress. Once you're awake, stable, and taking fluids well, you will be allowed to go home as long as there are no problems. You will also be allowed to pass your urine before discharge.You may be given antibiotics to help prevent infection. You may also be prescribed pain medicine if needed. In a week or two, your health care provider may remove your stent, if you have one. You may first  have an X-ray exam to check on how successful the fragmentation of your stone has been and how much of the stone has passed. Your health care provider will check to see whether or not stone particles remain. SEEK IMMEDIATE MEDICAL CARE IF:  You develop a fever or shaking chills.  Your pain is not relieved by medicine.  You feel sick to your stomach (nauseated) and you vomit.  You develop heavy bleeding.  You have difficulty urinating.  You start to pass your stent from your penis.   This information is not intended to replace advice given to you by your health care provider. Make sure you discuss any questions you have with your health care provider.   Document Released: 04/16/2000 Document Revised: 05/10/2014 Document Reviewed: 11/02/2012 Elsevier Interactive Patient Education 2016 [employer].  Renal Colic Renal colic is pain that is caused by passing a kidney stone. The pain can be sharp and severe. It may be felt in the back, abdomen, side (flank), or groin. It can cause nausea. Renal colic can come and go. HOME CARE INSTRUCTIONS Watch your condition for any changes. The following actions may help to lessen any discomfort that you are feeling:  Take medicines only as directed by your health care provider.  Ask your health care provider if it is okay to take over-the-counter pain medicine.  Drink enough fluid to keep your urine clear or pale yellow. Drink 6-8 glasses of water each day.  Limit the amount of salt that you eat to less than 2 grams per day.  Reduce the amount of protein in your diet. Eat less meat, fish, nuts, and dairy.  Avoid foods such as spinach, rhubarb, nuts, or bran. These may make kidney stones more likely to form. SEEK MEDICAL CARE IF:  You have a fever or chills.  Your urine smells bad or looks cloudy.  You have pain or   burning when you pass urine. SEEK IMMEDIATE MEDICAL CARE IF:  Your flank pain or groin pain suddenly worsens.  You become  confused or disoriented or you lose consciousness.   This information is not intended to replace advice given to you by your health care provider. Make sure you discuss any questions you have with your health care provider.   Document Released: 01/27/2005 Document Revised: 05/10/2014 Document Reviewed: 02/27/2014 Elsevier Interactive Patient Education 2016 [employer] Inc.  AMBULATORY SURGERY  DISCHARGE INSTRUCTIONS   1) The drugs that you were given will stay in your system until tomorrow so for the next 24 hours you should not:  A) Drive an automobile B) Make any legal decisions C) Drink any alcoholic beverage  2) You may resume regular meals tomorrow.  Today it is better to start with liquids and gradually work up to solid foods.  You may eat anything you prefer, but it is better to start with liquids, then soup and crackers, and gradually work up to solid foods.  3) Please notify your doctor immediately if you have any unusual bleeding, trouble breathing, redness and pain at the surgery site, drainage, fever, or pain not relieved by medication.  4) Additional Instructions:  Please contact your physician with any problems or Same Day Surgery at [phone], Monday through Friday 6 am to 4 pm, or [place] at  Main number at [phone]. 

## 2015-12-25 NOTE — OR Nursing (Signed)
Redness on right flank. No break in skin

## 2015-12-26 ENCOUNTER — Encounter: Payer: Self-pay | Admitting: Urology

## 2016-04-02 ENCOUNTER — Ambulatory Visit
Admission: RE | Admit: 2016-04-02 | Discharge: 2016-04-02 | Disposition: A | Discharge status: Home / Self Care | Payer: Commercial Managed Care - HMO | Source: Ambulatory Visit | Attending: Unknown Physician Specialty | Admitting: Unknown Physician Specialty

## 2016-04-02 ENCOUNTER — Encounter
Admission: RE | Disposition: A | Discharge status: Home / Self Care | Payer: Self-pay | Source: Ambulatory Visit | Attending: Unknown Physician Specialty

## 2016-04-02 ENCOUNTER — Ambulatory Visit: Payer: Commercial Managed Care - HMO | Admitting: Anesthesiology

## 2016-04-02 DIAGNOSIS — Z7951 Long term (current) use of inhaled steroids: Secondary | ICD-10-CM | POA: Insufficient documentation

## 2016-04-02 DIAGNOSIS — Z8719 Personal history of other diseases of the digestive system: Secondary | ICD-10-CM | POA: Insufficient documentation

## 2016-04-02 DIAGNOSIS — Z79899 Other long term (current) drug therapy: Secondary | ICD-10-CM | POA: Diagnosis not present

## 2016-04-02 DIAGNOSIS — I10 Essential (primary) hypertension: Secondary | ICD-10-CM | POA: Diagnosis not present

## 2016-04-02 DIAGNOSIS — K222 Esophageal obstruction: Secondary | ICD-10-CM | POA: Insufficient documentation

## 2016-04-02 DIAGNOSIS — Z1211 Encounter for screening for malignant neoplasm of colon: Secondary | ICD-10-CM | POA: Insufficient documentation

## 2016-04-02 DIAGNOSIS — R1310 Dysphagia, unspecified: Secondary | ICD-10-CM | POA: Diagnosis present

## 2016-04-02 HISTORY — PX: COLONOSCOPY WITH PROPOFOL: SHX5780

## 2016-04-02 HISTORY — DX: Major depressive disorder, single episode, unspecified: F32.9

## 2016-04-02 HISTORY — PX: ESOPHAGOGASTRODUODENOSCOPY (EGD) WITH PROPOFOL: SHX5813

## 2016-04-02 HISTORY — DX: Unspecified asthma, uncomplicated: J45.909

## 2016-04-02 HISTORY — DX: Anxiety disorder, unspecified: F41.9

## 2016-04-02 HISTORY — DX: Depression, unspecified: F32.A

## 2016-04-02 SURGERY — COLONOSCOPY WITH PROPOFOL
Anesthesia: General

## 2016-04-02 MED ORDER — PROPOFOL 10 MG/ML IV BOLUS
INTRAVENOUS | Status: DC | PRN
  Administered 2016-04-02: 70 mg via INTRAVENOUS
  Administered 2016-04-02: 30 mg via INTRAVENOUS

## 2016-04-02 MED ORDER — MIDAZOLAM HCL 5 MG/5ML IJ SOLN
INTRAMUSCULAR | Status: DC | PRN
  Administered 2016-04-02: 1 mg via INTRAVENOUS

## 2016-04-02 MED ORDER — SODIUM CHLORIDE 0.9 % IV SOLN: INTRAVENOUS | Status: DC

## 2016-04-02 MED ORDER — PROPOFOL 500 MG/50ML IV EMUL
INTRAVENOUS | Status: DC | PRN
  Administered 2016-04-02: 120 ug/kg/min via INTRAVENOUS

## 2016-04-02 MED ORDER — SODIUM CHLORIDE 0.9 % IV SOLN
INTRAVENOUS | Status: DC
  Administered 2016-04-02: 1000 mL via INTRAVENOUS

## 2016-04-02 MED ORDER — LIDOCAINE 2% (20 MG/ML) 5 ML SYRINGE
INTRAMUSCULAR | Status: DC | PRN
  Administered 2016-04-02: 25 mg via INTRAVENOUS

## 2016-04-02 MED ORDER — FENTANYL CITRATE (PF) 100 MCG/2ML IJ SOLN
INTRAMUSCULAR | Status: DC | PRN
  Administered 2016-04-02: 50 ug via INTRAVENOUS

## 2016-04-02 MED ORDER — GLYCOPYRROLATE 0.2 MG/ML IJ SOLN
INTRAMUSCULAR | Status: DC | PRN
  Administered 2016-04-02: 0.2 mg via INTRAVENOUS

## 2016-04-02 NOTE — Anesthesia Preprocedure Evaluation (Signed)
Anesthesia Evaluation  Patient identified by MRN, date of birth, ID band Patient awake    Reviewed: Allergy & Precautions, NPO status , Patient's Chart, lab work & pertinent test results  History of Anesthesia Complications Negative for: history of anesthetic complications  Airway Mallampati: II       Dental   Pulmonary asthma (as a child) ,  Pt with recent URI, resolving, no fever chills, no cough, lungs clear          Cardiovascular hypertension, Pt. on medications      Neuro/Psych Anxiety Depression negative neurological ROS     GI/Hepatic Neg liver ROS, GERD  ,  Endo/Other  negative endocrine ROS  Renal/GU Renal disease (stones)     Musculoskeletal   Abdominal   Peds  Hematology negative hematology ROS (+)   Anesthesia Other Findings   Reproductive/Obstetrics                            Anesthesia Physical Anesthesia Plan  ASA: II  Anesthesia Plan: General   Post-op Pain Management:    Induction: Intravenous  Airway Management Planned: Nasal Cannula  Additional Equipment:   Intra-op Plan:   Post-operative Plan:   Informed Consent: I have reviewed the patients History and Physical, chart, labs and discussed the procedure including the risks, benefits and alternatives for the proposed anesthesia with the patient or authorized representative who has indicated his/her understanding and acceptance.     Plan Discussed with:   Anesthesia Plan Comments:         Anesthesia Quick Evaluation

## 2016-04-02 NOTE — Transfer of Care (Signed)
Immediate Anesthesia Transfer of Care Note  Patient: Jesse Diaz  Procedure(s) Performed: Procedure(s): COLONOSCOPY WITH PROPOFOL (N/A) ESOPHAGOGASTRODUODENOSCOPY (EGD) WITH PROPOFOL (N/A)  Patient Location: Endoscopy Unit  Anesthesia Type:General  Level of Consciousness: sedated and responds to stimulation  Airway & Oxygen Therapy: Patient Spontanous Breathing and Patient connected to nasal cannula oxygen  Post-op Assessment: Report given to RN and Post -op Vital signs reviewed and stable  Post vital signs: Reviewed  Last Vitals:  Vitals:   04/02/16 0940 04/02/16 1042  BP: 140/84 121/79  Pulse: (!) 54 70  Resp: 16 18  Temp: (!) 35.9 C 36.1 C    Last Pain:  Vitals:   04/02/16 0940  TempSrc: Tympanic         Complications: No apparent anesthesia complications

## 2016-04-02 NOTE — Op Note (Signed)
[facility] Gastroenterology Patient Name: Jesse RailJames Radler Procedure Date: 04/02/2016 10:15 AM MRN: [id number] Account #: [id number] Date of Birth: 07/21/1960 Admit Type: Outpatient Age: [age] [facility] ENDO ROOM 4 Gender: Male Note Status: Finalized Procedure:            Upper GI endoscopy Indications:          Dysphagia Providers:            Scot Junobert T. Connie Lasater, MD Referring MD:         Hassell HalimMarcus E. Babaoff MD (Referring MD) Medicines:            Propofol per Anesthesia Complications:        No immediate complications. Procedure:            Pre-Anesthesia Assessment:                       - After reviewing the risks and benefits, the patient                        was deemed in satisfactory condition to undergo the                        procedure.                       After obtaining informed consent, the endoscope was                        passed under direct vision. Throughout the procedure,                        the patient's blood pressure, pulse, and oxygen                        saturations were monitored continuously. The                        Colonoscope was introduced through the mouth, and                        advanced to the second part of duodenum. The upper GI                        endoscopy was accomplished without difficulty. The                        patient tolerated the procedure well. Findings:      A mild Schatzki ring (acquired) was found at the gastroesophageal       junction. At the end of the procedure A guidewire was placed and the       scope was withdrawn. Dilation was performed with a Savary dilator with       mild resistance at 17 mm.      The stomach was normal.      The examined duodenum was normal. Impression:           - Mild Schatzki ring. Dilated.                       - Normal stomach.                       -  Normal examined duodenum.                       - No specimens collected. Recommendation:       - Perform a  colonoscopy today. Scot Junobert T Davida Falconi, MD 04/02/2016 10:26:00 AM This report has been signed electronically. Number of Addenda: 0 Note Initiated On: 04/02/2016 10:15 AM      [facility]

## 2016-04-02 NOTE — Anesthesia Postprocedure Evaluation (Signed)
Anesthesia Post Note  Patient: Jesse Diaz  Procedure(s) Performed: Procedure(s) (LRB): COLONOSCOPY WITH PROPOFOL (N/A) ESOPHAGOGASTRODUODENOSCOPY (EGD) WITH PROPOFOL (N/A)  Patient location during evaluation: Endoscopy Anesthesia Type: General Level of consciousness: awake and alert Pain management: pain level controlled Vital Signs Assessment: post-procedure vital signs reviewed and stable Respiratory status: spontaneous breathing and respiratory function stable Cardiovascular status: stable Anesthetic complications: no    Last Vitals:  Vitals:   04/02/16 1042 04/02/16 1050  BP: 121/79 121/79  Pulse: 70 (!) 59  Resp: 18 18  Temp: 36.1 C     Last Pain:  Vitals:   04/02/16 0940  TempSrc: Tympanic                 Denishia Citro K

## 2016-04-02 NOTE — Op Note (Signed)
[facility] Gastroenterology Patient Name: Jesse RailJames Bednarz Procedure Date: 04/02/2016 10:15 AM MRN: [id number] Account #: [id number] Date of Birth: 11/20/1960 Admit Type: Outpatient Age: [id number] [facility] 4 Gender: Male Note Status: Finalized Procedure:            Colonoscopy Indications:          High risk colon cancer surveillance: Personal history                        of colonic polyps Providers:            Scot Junobert T. Elliott, MD Referring MD:         Hassell HalimMarcus E. Babaoff MD (Referring MD) Medicines:            Propofol per Anesthesia Complications:        No immediate complications. Procedure:            Pre-Anesthesia Assessment:                       - After reviewing the risks and benefits, the patient                        was deemed in satisfactory condition to undergo the                        procedure.                       After obtaining informed consent, the colonoscope was                        passed under direct vision. Throughout the procedure,                        the patient's blood pressure, pulse, and oxygen                        saturations were monitored continuously. The                        Colonoscope was introduced through the anus and                        advanced to the the cecum, identified by appendiceal                        orifice and ileocecal valve. Findings:      The entire examined colon appeared normal. Prep very good. Impression:           - The entire examined colon is normal.                       - No specimens collected. Recommendation:       - Repeat colonoscopy in 5 years for surveillance. Scot Junobert T Elliott, MD 04/02/2016 10:40:49 AM This report has been signed electronically. Number of Addenda: 0 Note Initiated On: 04/02/2016 10:15 AM Scope Withdrawal Time: 0 hours 5 minutes 52 seconds  Total Procedure Duration: 0 hours 9 minutes 42 seconds       [facility]

## 2016-04-02 NOTE — H&P (Signed)
Primary Care Physician:  Rozanna BoxBABAOFF, MARC E, MD Primary Gastroenterologist:  Dr. Mechele CollinElliott  Pre-Procedure History & Physical: HPI:  Jesse Diaz is a 55 y.o. male is here for an endoscopy and colonoscopy.   Past Medical History:  Diagnosis Date  . Anxiety   . Asthma   . Depression   . GERD (gastroesophageal reflux disease)    in past  . Hypertension   . Sinus drainage   . Wears dentures    partial lower    Past Surgical History:  Procedure Laterality Date  . CARDIOVASCULAR STRESS TEST     approx 8 yrs ago - anxiety/reflux  . COLONOSCOPY    . ESOPHAGEAL DILATION     striction  . EXTRACORPOREAL SHOCK WAVE LITHOTRIPSY Right 12/25/2015   Procedure: EXTRACORPOREAL SHOCK WAVE LITHOTRIPSY (ESWL);  Surgeon: Orson ApeMichael R Wolff, MD;  Location: [facility] ORS;  Service: Urology;  Laterality: Right;  . KNEE ARTHROSCOPY Bilateral   . KNEE ARTHROSCOPY    . SEPTOPLASTY N/A 04/04/2015   Procedure: SEPTOPLASTY;  Surgeon: Linus Salmonshapman McQueen, MD;  [facility];  Service: ENT;  Laterality: N/A;  . TONSILLECTOMY    . TURBINATE REDUCTION N/A 04/04/2015   Procedure: TURBINATE REDUCTION SMR;  Surgeon: Linus Salmonshapman McQueen, MD;  [facility];  Service: ENT;  Laterality: N/A;  . UPPER GI ENDOSCOPY      Prior to Admission medications   Medication Sig Start Date End Date Taking? Authorizing Provider  BIOTIN PO Take by mouth.   Yes Historical Provider, MD  fluticasone (FLONASE) 50 MCG/ACT nasal spray Place into both nostrils daily.   Yes Historical Provider, MD  losartan-hydrochlorothiazide (HYZAAR) 50-12.5 MG tablet Take 1 tablet by mouth daily.   Yes Historical Provider, MD  Multiple Vitamin (MULTIVITAMIN) tablet Take 1 tablet by mouth daily.   Yes Historical Provider, MD  OVER THE COUNTER MEDICATION [place] diet supplement   Yes Historical Provider, MD  levofloxacin (LEVAQUIN) 500 MG tablet Take 1 tablet (500 mg total) by mouth daily. Patient not taking: Reported on 04/01/2016  12/25/15   Orson ApeMichael R Wolff, MD  NUCYNTA 50 MG tablet Take 1 tablet (50 mg total) by mouth every 6 (six) hours as needed. 1 TO 2 TABS Q 6 HOURS PRN PAIN Patient not taking: Reported on 04/01/2016 12/25/15   Orson ApeMichael R Wolff, MD  ondansetron (ZOFRAN ODT) 8 MG disintegrating tablet Take 1 tablet (8 mg total) by mouth every 6 (six) hours as needed for nausea or vomiting. Patient not taking: Reported on 04/01/2016 12/25/15   Orson ApeMichael R Wolff, MD  tamsulosin (FLOMAX) 0.4 MG CAPS capsule Take 1 capsule (0.4 mg total) by mouth daily. Patient not taking: Reported on 04/02/2016 12/25/15   Orson ApeMichael R Wolff, MD    Allergies as of 02/23/2016  . (No Known Allergies)    History reviewed. No pertinent family history.  Social History   Social History  . Marital status: Married    Spouse name: N/A  . Number of children: N/A  . Years of education: N/A   Occupational History  . Not on file.   Social History Main Topics  . Smoking status: Never Smoker  . Smokeless tobacco: Former NeurosurgeonUser     Comment: quit chew - approx 2008  . Alcohol use No  . Drug use: No  . Sexual activity: Not on file   Other Topics Concern  . Not on file   Social History Narrative  . No narrative on file    Review of Systems:  See HPI, otherwise negative ROS  Physical Exam: BP 140/84   Pulse (!) 54   Temp (!) 96.7 F (35.9 C) (Tympanic)   Resp 16   Ht 5\' 9"  (1.753 m)   Wt 93 kg (205 lb)   SpO2 100%   BMI 30.27 kg/m  General:   Alert,  pleasant and cooperative in NAD Head:  Normocephalic and atraumatic. Neck:  Supple; no masses or thyromegaly. Lungs:  Clear throughout to auscultation.    Heart:  Regular rate and rhythm. Abdomen:  Soft, nontender and nondistended. Normal bowel sounds, without guarding, and without rebound.   Neurologic:  Alert and  oriented x4;  grossly normal neurologically.  Impression/Plan: Jesse Diaz is here for an endoscopy and colonoscopy to be performed for dysphagia and PH colon  polyps  Risks, benefits, limitations, and alternatives regarding  endoscopy and colonoscopy have been reviewed with the patient.  Questions have been answered.  All parties agreeable.   Lynnae PrudeELLIOTT, Karema Tocci, MD  04/02/2016, 10:07 AM

## 2016-04-05 ENCOUNTER — Encounter: Payer: Self-pay | Admitting: Unknown Physician Specialty

## 2016-08-09 DIAGNOSIS — I10 Essential (primary) hypertension: Secondary | ICD-10-CM

## 2016-08-27 ENCOUNTER — Ambulatory Visit (INDEPENDENT_AMBULATORY_CARE_PROVIDER_SITE_OTHER): Payer: Commercial Managed Care - HMO | Admitting: Family Medicine

## 2016-08-27 ENCOUNTER — Encounter: Payer: Self-pay | Admitting: Family Medicine

## 2016-08-27 VITALS — BP 123/79 | HR 61 | Temp 97.8°F | Ht 68.0 in | Wt 204.0 lb

## 2016-08-27 DIAGNOSIS — Z7689 Persons encountering health services in other specified circumstances: Secondary | ICD-10-CM | POA: Diagnosis not present

## 2016-08-27 DIAGNOSIS — I10 Essential (primary) hypertension: Secondary | ICD-10-CM

## 2016-08-27 DIAGNOSIS — L84 Corns and callosities: Secondary | ICD-10-CM | POA: Diagnosis not present

## 2016-08-27 MED ORDER — LOSARTAN POTASSIUM-HCTZ 50-12.5 MG PO TABS: 1.0000 | ORAL_TABLET | Freq: Every day | ORAL | 1 refills | Status: DC

## 2016-08-27 MED ORDER — AMMONIUM LACTATE 12 % EX CREA: TOPICAL_CREAM | CUTANEOUS | 1 refills | Status: DC | PRN

## 2016-08-27 NOTE — Progress Notes (Signed)
BP 123/79   Pulse 61   Temp 97.8 F (36.6 C)   Ht  (1.727 m)   Wt 204 lb (92.5 kg)   SpO2 98%   BMI 31.02 kg/m    Subjective:    Patient ID: Jesse Diaz, male    DOB: Jan 10, 1961, 56 y.o.   MRN: [id number]  HPI: Jesse Diaz is a 56 y.o. male  Chief Complaint  Patient presents with  . Establish Care    No concerns  . Dry Feet    heels of his feet are very dry and cracking.   Marland Kitchen Hypertension    does need a refill on his med   Home BPs about once/month and has been getting 120s-130s/80s.   Patient presents today to establish care. Last PE was 04/2015, last prostate exam 12/2015 with urology. Was followed for possible BPH, previously on tamsulosin, but has since d/c'd it because Urology on exam stated that his prostate was WNL and the medication did not benefit him. Only medication pt currently takes is losartan HCTZ for HTN. Doing well with medication, no side effects noted and taking faithfully. BPs well controlled when checked. Checking home BPs about once/month and has been getting 120s-130s/80s.   Only other concern at the moment is some dry, cracked skin on his heels that has become worse the past 6 months or so. 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 Has tried some creams in the past for overnight use but has never been diligent about foot care. Denies itching, flaking skin, toenail changes, sensation changes.   Past Medical History:  Diagnosis Date  . Anxiety   . Asthma   . Depression   . GERD (gastroesophageal reflux disease)    in past  . Hypertension   . Sinus drainage   . Wears dentures    partial lower   Social History   Social History  . Marital status: Married    Spouse name: N/A  . Number of children: N/A  . Years of education: N/A   Occupational History  . Not on file.   Social History Main Topics  . Smoking status: Never Smoker  . Smokeless tobacco: Former Neurosurgeon    Quit date: 05/03/2006   Comment: quit chew - approx 2008  . Alcohol use No  . Drug use: No  . Sexual activity: Not on file   Other Topics Concern  . Not on file   Social History Narrative  . No narrative on file   Relevant past medical, surgical, family and social history reviewed and updated as indicated. Interim medical history since our last visit reviewed. Allergies and medications reviewed and updated.  Review of Systems  Constitutional: Negative.   HENT: Negative.   Eyes: Negative.   Respiratory: Negative.   Gastrointestinal: Negative.   Genitourinary: Negative.   Musculoskeletal: Negative.   Skin:       Skin changes bottoms of feet b/l  Neurological: Negative.   Psychiatric/Behavioral: Negative.    Per HPI unless specifically indicated above     Objective:    BP 123/79   Pulse 61   Temp 97.8 F (36.6 C)   Ht  (1.727 m)   Wt 204 lb (92.5 kg)   SpO2 98%   BMI 31.02 kg/m   Wt Readings from Last 3 Encounters:  08/27/16 204 lb (92.5 kg)  04/02/16 205 lb (93 kg)  12/25/15 210  lb (95.3 kg)    Physical Exam  Constitutional: He is oriented to person, place, and time. He appears well-developed and well-nourished. No distress.  HENT:  Head: Atraumatic.  Eyes: Conjunctivae are normal. Pupils are equal, round, and reactive to light. No scleral icterus.  Neck: Normal range of motion. Neck supple.  Cardiovascular: Normal rate and normal heart sounds.   Pulmonary/Chest: Effort normal and breath sounds normal. No respiratory distress.  Musculoskeletal: Normal range of motion.  Lymphadenopathy:    He has no cervical adenopathy.  Neurological: He is alert and oriented to person, place, and time.  Skin: Skin is warm and dry.  B/l heels callused and cracked  Psychiatric: He has a normal mood and affect. His behavior is normal.  Nursing note and vitals reviewed.     Assessment & Plan:   Problem List Items Addressed This Visit      Cardiovascular and Mediastinum   Hypertension     Stable on losartan HCTZ, continue current regimen. Continue to monitor occasionally at home      Relevant Medications   losartan-hydrochlorothiazide (HYZAAR) 50-12.5 MG tablet    Other Visit Diagnoses    Encounter to establish care    -  Primary   Will set up a CPE in the next few months and come fasting for bloodwork   Callus of foot       Discussed daily moisturizing regimen with amlactin cream, good supportive shoes for outdoor work, regular soaks and exfoliation       Follow up plan: Return for CPE.

## 2016-08-30 NOTE — Patient Instructions (Signed)
Follow-up for Annual Physical Exam

## 2016-08-30 NOTE — Assessment & Plan Note (Signed)
Stable on losartan HCTZ, continue current regimen. Continue to monitor occasionally at home

## 2017-02-02 ENCOUNTER — Other Ambulatory Visit: Payer: Self-pay | Admitting: Family Medicine

## 2017-02-02 NOTE — Telephone Encounter (Signed)
Routing to provider. Appt 02/28/17

## 2017-02-28 ENCOUNTER — Encounter: Payer: Self-pay | Admitting: Family Medicine

## 2017-02-28 ENCOUNTER — Ambulatory Visit (INDEPENDENT_AMBULATORY_CARE_PROVIDER_SITE_OTHER): Payer: 59 | Admitting: Family Medicine

## 2017-02-28 VITALS — BP 148/85 | HR 54 | Temp 98.3°F | Ht 68.5 in | Wt 213.2 lb

## 2017-02-28 DIAGNOSIS — M79671 Pain in right foot: Secondary | ICD-10-CM

## 2017-02-28 DIAGNOSIS — Z1159 Encounter for screening for other viral diseases: Secondary | ICD-10-CM

## 2017-02-28 DIAGNOSIS — Z0000 Encounter for general adult medical examination without abnormal findings: Secondary | ICD-10-CM

## 2017-02-28 DIAGNOSIS — Z23 Encounter for immunization: Secondary | ICD-10-CM

## 2017-02-28 DIAGNOSIS — Z8619 Personal history of other infectious and parasitic diseases: Secondary | ICD-10-CM

## 2017-02-28 DIAGNOSIS — I10 Essential (primary) hypertension: Secondary | ICD-10-CM

## 2017-02-28 DIAGNOSIS — Z114 Encounter for screening for human immunodeficiency virus [HIV]: Secondary | ICD-10-CM

## 2017-02-28 DIAGNOSIS — M79672 Pain in left foot: Secondary | ICD-10-CM | POA: Diagnosis not present

## 2017-02-28 LAB — UA/M W/RFLX CULTURE, ROUTINE
BILIRUBIN UA: NEGATIVE
GLUCOSE, UA: NEGATIVE
Ketones, UA: NEGATIVE
LEUKOCYTES UA: NEGATIVE
Nitrite, UA: NEGATIVE
PH UA: 7 (ref 5.0–7.5)
RBC UA: NEGATIVE
SPEC GRAV UA: 1.02 (ref 1.005–1.030)
UUROB: 0.2 mg/dL (ref 0.2–1.0)

## 2017-02-28 LAB — MICROSCOPIC EXAMINATION
BACTERIA UA: NONE SEEN
RBC, UA: NONE SEEN /hpf (ref 0–?)
WBC UA: NONE SEEN /HPF (ref 0–?)

## 2017-02-28 MED ORDER — LOSARTAN POTASSIUM-HCTZ 50-12.5 MG PO TABS: 1.0000 | ORAL_TABLET | Freq: Every day | ORAL | 1 refills | Status: DC

## 2017-02-28 MED ORDER — GABAPENTIN 300 MG PO CAPS: ORAL_CAPSULE | ORAL | 3 refills | Status: DC

## 2017-02-28 NOTE — Progress Notes (Signed)
BP (!) 148/85 (BP Location: Right Arm, Patient Position: Sitting, Cuff Size: Large)   Pulse (!) 54   Temp 98.3 F (36.8 C)   Ht 5' 8.5" (1.74 m)   Wt 213 lb 3.2 oz (96.7 kg)   SpO2 99%   BMI 31.95 kg/m    Subjective:    Patient ID: Jesse Diaz, male    DOB: 12/20/1960, 56 y.o.   MRN: [id number]  HPI: Jesse Diaz is a 56 y.o. male presenting on 02/28/2017 for comprehensive medical examination. Current medical complaints include:Has been checking BPs at home, almost always below 135/85 with no sxs.   Used to be on medicine for enlarged prostate, Urologist examined him about a year ago and told him he did not have an enlarged prostate so he stopped taking the medicines. Not having any nocturia, pain, hesitancy.   Has been having occasional bloody ejaculate the past few months that's concerning him. No pain with ejaculation.   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 Was seen for this years ago and started getting injections in his feet but did not agree with that form of treatment so stopped. No numbness, some stiffness in the mornings. Worst with weight bearing. Sometimes notes restless legs.   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 Wanting to be checked again. Daughter was just diagnosed again. Not currently having GI sxs.   Depression Screen done today and results listed below:  [facility]  Decreased Interest 0  Down, Depressed, Hopeless 0  PHQ - 2 Score 0  Altered sleeping 2  Tired, decreased energy 1  Change in appetite 1  Feeling bad or failure about yourself  0  Trouble concentrating 0  Moving slowly or fidgety/restless 0  Suicidal thoughts 0  PHQ-9 Score 4    The patient does not have a history of falls. I did not complete a risk assessment for falls. A plan of care for falls was not documented.   Past Medical  History:  Past Medical History:  Diagnosis Date  . Anxiety   . Asthma   . Depression   . GERD (gastroesophageal reflux disease)    in past  . Hypertension   . Sinus drainage   . Wears dentures    partial lower    Surgical History:  Past Surgical History:  Procedure Laterality Date  . CARDIOVASCULAR STRESS TEST     approx 8 yrs ago - anxiety/reflux  . COLONOSCOPY    . COLONOSCOPY WITH PROPOFOL N/A 04/02/2016   Procedure: COLONOSCOPY WITH PROPOFOL;  Surgeon: Scot Junobert T Elliott, MD;  [facility];  Service: Endoscopy;  Laterality: N/A;  . ESOPHAGEAL DILATION     striction  . ESOPHAGOGASTRODUODENOSCOPY (EGD) WITH PROPOFOL N/A 04/02/2016   Procedure: ESOPHAGOGASTRODUODENOSCOPY (EGD) WITH PROPOFOL;  Surgeon: Scot Junobert T Elliott, MD;  [facility];  Service: Endoscopy;  Laterality: N/A;  . EXTRACORPOREAL SHOCK WAVE LITHOTRIPSY Right 12/25/2015   Procedure: EXTRACORPOREAL SHOCK WAVE LITHOTRIPSY (ESWL);  Surgeon: Orson ApeMichael R Wolff, MD;  Location: [facility] ORS;  Service: Urology;  Laterality: Right;  . KNEE ARTHROSCOPY Bilateral   . KNEE ARTHROSCOPY    . SEPTOPLASTY N/A 04/04/2015   Procedure: SEPTOPLASTY;  Surgeon: Linus Salmonshapman McQueen, MD;  [facility];  Service: ENT;  Laterality: N/A;  . TONSILLECTOMY    . TURBINATE REDUCTION N/A  04/04/2015   Procedure: TURBINATE REDUCTION SMR;  Surgeon: Linus Salmons, MD;  Location: [facility];  Service: ENT;  Laterality: N/A;  . UPPER GI ENDOSCOPY      Medications:  Current Outpatient Prescriptions on File Prior to Visit  Medication Sig  . ammonium lactate (AMLACTIN) 12 % cream Apply topically as needed for dry skin. (Patient not taking: Reported on 02/28/2017)  . Multiple Vitamin (MULTIVITAMIN) tablet Take 1 tablet by mouth daily.   No current facility-administered medications on file prior to visit.     Allergies:  No Known Allergies  Social History:  Social History   Social History  . Marital status:  Married    Spouse name: N/A  . Number of children: N/A  . Years of education: N/A   Occupational History  . Not on file.   Social History Main Topics  . Smoking status: Never Smoker  . Smokeless tobacco: Former Neurosurgeon    Quit date: 05/03/2006     Comment: quit chew - approx 2008  . Alcohol use No  . Drug use: No  . Sexual activity: Not on file   Other Topics Concern  . Not on file   Social History Narrative  . No narrative on file   History  Smoking Status  . Never Smoker  Smokeless Tobacco  . Former Neurosurgeon  . Quit date: 05/03/2006    Comment: quit chew - approx 2008   History  Alcohol Use No    Family History:  Family History  Problem Relation Age of Onset  . Stroke Mother   . Colon polyps Mother   . Hypertension Mother   . Leukemia Father   . Cancer Father        prostate  . Heart attack Father   . Stroke Father   . Colon polyps Sister   . Cancer Maternal Aunt   . Diabetes Maternal Aunt   . Hypertension Maternal Aunt   . Cancer Maternal Uncle        prostate  . Diabetes Maternal Uncle   . Hypertension Maternal Uncle     Past medical history, surgical history, medications, allergies, family history and social history reviewed with patient today and changes made to appropriate areas of the chart.   Review of Systems - General ROS: negative Psychological ROS: negative Ophthalmic ROS: negative ENT ROS: negative Allergy and Immunology ROS: negative Respiratory ROS: no cough, shortness of breath, or wheezing Cardiovascular ROS: no chest pain or dyspnea on exertion Gastrointestinal ROS: no abdominal pain, change in bowel habits, or black or bloody stools Genito-Urinary ROS: no dysuria, trouble voiding, or hematuria Musculoskeletal ROS: positive for - foot pain b/l Neurological ROS: no TIA or stroke symptoms Dermatological ROS: negative All other ROS negative except what is listed above and in the HPI.      Objective:    BP (!) 148/85 (BP Location: Right  Arm, Patient Position: Sitting, Cuff Size: Large)   Pulse (!) 54   Temp 98.3 F (36.8 C)   Ht 5' 8.5" (1.74 m)   Wt 213 lb 3.2 oz (96.7 kg)   SpO2 99%   BMI 31.95 kg/m   Wt Readings from Last 3 Encounters:  02/28/17 213 lb 3.2 oz (96.7 kg)  08/27/16 204 lb (92.5 kg)  04/02/16 205 lb (93 kg)    Physical Exam  Constitutional: He is oriented to person, place, and time. He appears well-developed and well-nourished. No distress.  HENT:  Head: Atraumatic.  Right Ear: External  ear normal.  Left Ear: External ear normal.  Nose: Nose normal.  Mouth/Throat: Oropharynx is clear and moist.  Eyes: Pupils are equal, round, and reactive to light. Conjunctivae are normal. No scleral icterus.  Neck: Normal range of motion. Neck supple.  Cardiovascular: Normal rate, regular rhythm, normal heart sounds and intact distal pulses.   No murmur heard. Pulmonary/Chest: Effort normal and breath sounds normal. No respiratory distress.  Abdominal: Soft. Bowel sounds are normal. He exhibits no distension and no mass. There is no tenderness. There is no guarding.  Genitourinary: Prostate normal.  Musculoskeletal: Normal range of motion. He exhibits no edema or tenderness.  Neurological: He is alert and oriented to person, place, and time. He has normal reflexes.  Skin: Skin is warm and dry. No rash noted.  Psychiatric: He has a normal mood and affect. His behavior is normal.  Nursing note and vitals reviewed.  Results for orders placed or performed in visit on 02/28/17  Microscopic Examination  Result Value Ref Range   WBC, UA None seen 0 - 5 /hpf   RBC, UA None seen 0 - 2 /hpf   Epithelial Cells (non renal) CANCELED    Bacteria, UA None seen None seen/Few  CBC with Differential/Platelet  Result Value Ref Range   WBC 5.0 3.4 - 10.8 x10E3/uL   RBC 5.01 4.14 - 5.80 x10E6/uL   Hemoglobin 15.4 13.0 - 17.7 g/dL   Hematocrit 96.0 45.4 - 51.0 %   MCV 89 79 - 97 fL   MCH 30.7 26.6 - 33.0 pg   MCHC 34.5  31.5 - 35.7 g/dL   RDW 09.8 11.9 - 14.7 %   Platelets 234 150 - 379 x10E3/uL   Neutrophils 52 Not Estab. %   Lymphs 28 Not Estab. %   Monocytes 9 Not Estab. %   Eos 10 Not Estab. %   Basos 1 Not Estab. %   Neutrophils Absolute 2.5 1.4 - 7.0 x10E3/uL   Lymphocytes Absolute 1.4 0.7 - 3.1 x10E3/uL   Monocytes Absolute 0.4 0.1 - 0.9 x10E3/uL   EOS (ABSOLUTE) 0.5 (H) 0.0 - 0.4 x10E3/uL   Basophils Absolute 0.1 0.0 - 0.2 x10E3/uL   Immature Granulocytes 0 Not Estab. %   Immature Grans (Abs) 0.0 0.0 - 0.1 x10E3/uL  Comprehensive metabolic panel  Result Value Ref Range   Glucose 89 65 - 99 mg/dL   BUN 13 6 - 24 mg/dL   Creatinine, Ser 8.29 0.76 - 1.27 mg/dL   GFR calc non Af Amer 85 >59 mL/min/1.73   GFR calc Af Amer 99 >59 mL/min/1.73   BUN/Creatinine Ratio 13 9 - 20   Sodium 142 134 - 144 mmol/L   Potassium 4.6 3.5 - 5.2 mmol/L   Chloride 101 96 - 106 mmol/L   CO2 25 20 - 29 mmol/L   Calcium 9.2 8.7 - 10.2 mg/dL   Total Protein 7.1 6.0 - 8.5 g/dL   Albumin 4.3 3.5 - 5.5 g/dL   Globulin, Total 2.8 1.5 - 4.5 g/dL   Albumin/Globulin Ratio 1.5 1.2 - 2.2   Bilirubin Total 0.5 0.0 - 1.2 mg/dL   Alkaline Phosphatase 65 39 - 117 IU/L   AST 18 0 - 40 IU/L   ALT 22 0 - 44 IU/L  Lipid Panel w/o Chol/HDL Ratio  Result Value Ref Range   Cholesterol, Total 169 100 - 199 mg/dL   Triglycerides 562 (H) 0 - 149 mg/dL   HDL 40 >13 mg/dL   VLDL Cholesterol Cal 36  5 - 40 mg/dL   LDL Calculated 93 0 - 99 mg/dL  TSH  Result Value Ref Range   TSH 1.520 0.450 - 4.500 uIU/mL  UA/M w/rflx Culture, Routine  Result Value Ref Range   Specific Gravity, UA 1.020 1.005 - 1.030   pH, UA 7.0 5.0 - 7.5   Color, UA Yellow Yellow   Appearance Ur Clear Clear   Leukocytes, UA Negative Negative   Protein, UA 1+ (A) Negative/Trace   Glucose, UA Negative Negative   Ketones, UA Negative Negative   RBC, UA Negative Negative   Bilirubin, UA Negative Negative   Urobilinogen, Ur 0.2 0.2 - 1.0 mg/dL   Nitrite,  UA Negative Negative   Microscopic Examination See below:   HIV antibody  Result Value Ref Range   HIV Screen 4th Generation wRfx Non Reactive Non Reactive  Hepatitis C Antibody  Result Value Ref Range   Hep C Virus Ab <0.1 0.0 - 0.9 s/co ratio      Assessment & Plan:   Problem List Items Addressed This Visit      Cardiovascular and Mediastinum   Hypertension - Primary    Stable and WNL on current regimen. Continue Hyzaar, DASH diet, good exercise habits.       Relevant Medications   losartan-hydrochlorothiazide (HYZAAR) 50-12.5 MG tablet   Other Relevant Orders   CBC with Differential/Platelet (Completed)   Comprehensive metabolic panel (Completed)   UA/M w/rflx Culture, Routine (Completed)    Other Visit Diagnoses    Annual physical exam       Relevant Orders   Lipid Panel w/o Chol/HDL Ratio (Completed)   TSH (Completed)   Need for influenza vaccination       Relevant Orders   Flu Vaccine QUAD 6+ mos PF IM (Fluarix Quad PF) (Completed)   Encounter for screening for HIV       Relevant Orders   HIV antibody (Completed)   Need for hepatitis C screening test       Relevant Orders   Hepatitis C Antibody (Completed)   History of tapeworm infection       Will get stool ova and parasites for pt reassurance given his daily exposures and hx of tapeworms. Await results   Relevant Orders   Ova and parasite examination   Pain in both feet       Will start gabapentin and monitor for relief. Wear plantar fasciitis braces at night, ensure orthotic, supportive shoes when working. Elevate feet at rest       Discussed aspirin prophylaxis for myocardial infarction prevention and decision was it was not indicated  LABORATORY TESTING:  Health maintenance labs ordered today as discussed above.   The natural history of prostate cancer and ongoing controversy regarding screening and potential treatment outcomes of prostate cancer has been discussed with the patient. The meaning of a  false positive PSA and a false negative PSA has been discussed. He indicates understanding of the limitations of this screening test and wishes not to proceed with screening PSA testing.   IMMUNIZATIONS:   - Tdap: Tetanus vaccination status reviewed: last tetanus booster within 10 years. - Influenza: Administered today  SCREENING: - Colonoscopy: Up to date  Discussed with patient purpose of the colonoscopy is to detect colon cancer at curable precancerous or early stages   PATIENT COUNSELING:    Sexuality: Discussed sexually transmitted diseases, partner selection, use of condoms, avoidance of unintended pregnancy  and contraceptive alternatives.   Advised to avoid cigarette smoking.  I discussed with the patient that most people either abstain from alcohol or drink within safe limits (<=14/week and <=4 drinks/occasion for males, <=7/weeks and <= 3 drinks/occasion for females) and that the risk for alcohol disorders and other health effects rises proportionally with the number of drinks per week and how often a drinker exceeds daily limits.  Discussed cessation/primary prevention of drug use and availability of treatment for abuse.   Diet: Encouraged to adjust caloric intake to maintain  or achieve ideal body weight, to reduce intake of dietary saturated fat and total fat, to limit sodium intake by avoiding high sodium foods and not adding table salt, and to maintain adequate dietary potassium and calcium preferably from fresh fruits, vegetables, and low-fat dairy products.    stressed the importance of regular exercise  Injury prevention: Discussed safety belts, safety helmets, smoke detector, smoking near bedding or upholstery.   Dental health: Discussed importance of regular tooth brushing, flossing, and dental visits.   Follow up plan: NEXT PREVENTATIVE PHYSICAL DUE IN 1 YEAR. Return in about 3 months (around 05/31/2017) for Foot pain f/u.

## 2017-02-28 NOTE — Patient Instructions (Signed)

## 2017-03-01 ENCOUNTER — Other Ambulatory Visit: Payer: 59

## 2017-03-01 ENCOUNTER — Encounter: Payer: Self-pay | Admitting: Family Medicine

## 2017-03-01 LAB — COMPREHENSIVE METABOLIC PANEL
A/G RATIO: 1.5 (ref 1.2–2.2)
ALK PHOS: 65 IU/L (ref 39–117)
ALT: 22 IU/L (ref 0–44)
AST: 18 IU/L (ref 0–40)
Albumin: 4.3 g/dL (ref 3.5–5.5)
BUN/Creatinine Ratio: 13 (ref 9–20)
BUN: 13 mg/dL (ref 6–24)
Bilirubin Total: 0.5 mg/dL (ref 0.0–1.2)
CALCIUM: 9.2 mg/dL (ref 8.7–10.2)
CHLORIDE: 101 mmol/L (ref 96–106)
CO2: 25 mmol/L (ref 20–29)
Creatinine, Ser: 0.99 mg/dL (ref 0.76–1.27)
GFR calc Af Amer: 99 mL/min/{1.73_m2} (ref >59)
GFR, EST NON AFRICAN AMERICAN: 85 mL/min/{1.73_m2} (ref >59)
Globulin, Total: 2.8 g/dL (ref 1.5–4.5)
Glucose: 89 mg/dL (ref 65–99)
POTASSIUM: 4.6 mmol/L (ref 3.5–5.2)
Sodium: 142 mmol/L (ref 134–144)
Total Protein: 7.1 g/dL (ref 6.0–8.5)

## 2017-03-01 LAB — CBC WITH DIFFERENTIAL/PLATELET
BASOS: 1 %
Basophils Absolute: 0.1 10*3/uL (ref 0.0–0.2)
EOS (ABSOLUTE): 0.5 10*3/uL — AB (ref 0.0–0.4)
Eos: 10 %
Hematocrit: 44.6 % (ref 37.5–51.0)
Hemoglobin: 15.4 g/dL (ref 13.0–17.7)
IMMATURE GRANULOCYTES: 0 %
Immature Grans (Abs): 0 10*3/uL (ref 0.0–0.1)
Lymphocytes Absolute: 1.4 10*3/uL (ref 0.7–3.1)
Lymphs: 28 %
MCH: 30.7 pg (ref 26.6–33.0)
MCHC: 34.5 g/dL (ref 31.5–35.7)
MCV: 89 fL (ref 79–97)
MONOS ABS: 0.4 10*3/uL (ref 0.1–0.9)
Monocytes: 9 %
NEUTROS PCT: 52 %
Neutrophils Absolute: 2.5 10*3/uL (ref 1.4–7.0)
PLATELETS: 234 10*3/uL (ref 150–379)
RBC: 5.01 x10E6/uL (ref 4.14–5.80)
RDW: 15.1 % (ref 12.3–15.4)
WBC: 5 10*3/uL (ref 3.4–10.8)

## 2017-03-01 LAB — HIV ANTIBODY (ROUTINE TESTING W REFLEX): HIV Screen 4th Generation wRfx: NONREACTIVE

## 2017-03-01 LAB — LIPID PANEL W/O CHOL/HDL RATIO
CHOLESTEROL TOTAL: 169 mg/dL (ref 100–199)
HDL: 40 mg/dL (ref >39)
LDL Calculated: 93 mg/dL (ref 0–99)
TRIGLYCERIDES: 178 mg/dL — AB (ref 0–149)
VLDL Cholesterol Cal: 36 mg/dL (ref 5–40)

## 2017-03-01 LAB — TSH: TSH: 1.52 u[IU]/mL (ref 0.450–4.500)

## 2017-03-01 LAB — HEPATITIS C ANTIBODY: Hep C Virus Ab: <0.1 s/co ratio (ref 0.0–0.9)

## 2017-03-03 LAB — OVA AND PARASITE EXAMINATION

## 2017-03-03 NOTE — Assessment & Plan Note (Signed)
Stable and WNL on current regimen. Continue Hyzaar, DASH diet, good exercise habits.

## 2017-05-31 ENCOUNTER — Ambulatory Visit: Payer: Commercial Managed Care - HMO | Admitting: Family Medicine

## 2017-07-22 DIAGNOSIS — J019 Acute sinusitis, unspecified: Secondary | ICD-10-CM | POA: Diagnosis not present

## 2017-08-30 ENCOUNTER — Telehealth: Payer: Self-pay | Admitting: Family Medicine

## 2017-08-30 NOTE — Telephone Encounter (Signed)
Copied from CRM [id number]. Topic: Quick Communication - See Telephone Encounter >> Aug 30, 2017  3:12 PM Marylen Ponto wrote: CRM for notification. See Telephone encounter for: 08/30/17. Patient calling in today stating he has a lot studying to do and he would like to know if it is safe for him to take Brain Awake Red by Aura Camps. Patient does not want it to effect him since he takes Losartan. Please contact patient.

## 2017-08-31 NOTE — Telephone Encounter (Signed)
Left message on machine for pt to return call to the office. CRM updated.  

## 2017-08-31 NOTE — Telephone Encounter (Signed)
I don't see an issue from the ingredient list, but these supplements are not FDA regulated so it is impossible to certify exactly what ingredients/amounts are involved. Should not impact his blood pressure medicines though, he just needs to watch how he feels carefully with it

## 2017-09-01 NOTE — Telephone Encounter (Signed)
Called and left patient a VM asking for him to please return my call.  

## 2017-09-02 NOTE — Telephone Encounter (Signed)
Left message on machine for pt to return call to the office.  

## 2017-09-10 DIAGNOSIS — H40033 Anatomical narrow angle, bilateral: Secondary | ICD-10-CM | POA: Diagnosis not present

## 2017-09-10 DIAGNOSIS — H1013 Acute atopic conjunctivitis, bilateral: Secondary | ICD-10-CM | POA: Diagnosis not present

## 2017-09-12 NOTE — Telephone Encounter (Signed)
Patient returned phone call and notified of information

## 2017-09-12 NOTE — Telephone Encounter (Signed)
Have tried calling patient multiple times. No answer, no return phone call. Ok for letter?

## 2017-09-12 NOTE — Telephone Encounter (Signed)
Please send letter.

## 2017-09-12 NOTE — Telephone Encounter (Signed)
returning call, will call back at 3

## 2017-10-03 DIAGNOSIS — M9904 Segmental and somatic dysfunction of sacral region: Secondary | ICD-10-CM | POA: Diagnosis not present

## 2017-10-03 DIAGNOSIS — M9903 Segmental and somatic dysfunction of lumbar region: Secondary | ICD-10-CM | POA: Diagnosis not present

## 2017-10-03 DIAGNOSIS — M545 Low back pain: Secondary | ICD-10-CM | POA: Diagnosis not present

## 2017-10-06 DIAGNOSIS — M9904 Segmental and somatic dysfunction of sacral region: Secondary | ICD-10-CM | POA: Diagnosis not present

## 2017-10-06 DIAGNOSIS — M545 Low back pain: Secondary | ICD-10-CM | POA: Diagnosis not present

## 2017-10-06 DIAGNOSIS — M9903 Segmental and somatic dysfunction of lumbar region: Secondary | ICD-10-CM | POA: Diagnosis not present

## 2017-11-09 ENCOUNTER — Other Ambulatory Visit: Payer: Self-pay | Admitting: Family Medicine

## 2017-11-09 NOTE — Telephone Encounter (Signed)
Losartan/HCTZ refill Last Refill:02/28/17 # 90 Last OV: 02/28/17 PCP: Roosvelt Maserachel Lane PA-C [facility]

## 2017-11-21 ENCOUNTER — Encounter: Payer: Self-pay | Admitting: Family Medicine

## 2017-11-21 ENCOUNTER — Ambulatory Visit: Payer: 59 | Admitting: Family Medicine

## 2017-11-21 ENCOUNTER — Other Ambulatory Visit: Payer: Self-pay

## 2017-11-21 VITALS — BP 136/88 | HR 60 | Temp 97.9°F | Ht 68.0 in | Wt 210.0 lb

## 2017-11-21 DIAGNOSIS — M79672 Pain in left foot: Secondary | ICD-10-CM

## 2017-11-21 DIAGNOSIS — Z8719 Personal history of other diseases of the digestive system: Secondary | ICD-10-CM | POA: Insufficient documentation

## 2017-11-21 DIAGNOSIS — I10 Essential (primary) hypertension: Secondary | ICD-10-CM | POA: Diagnosis not present

## 2017-11-21 DIAGNOSIS — M79671 Pain in right foot: Secondary | ICD-10-CM

## 2017-11-21 DIAGNOSIS — R21 Rash and other nonspecific skin eruption: Secondary | ICD-10-CM | POA: Diagnosis not present

## 2017-11-21 DIAGNOSIS — F418 Other specified anxiety disorders: Secondary | ICD-10-CM | POA: Insufficient documentation

## 2017-11-21 DIAGNOSIS — B353 Tinea pedis: Secondary | ICD-10-CM

## 2017-11-21 MED ORDER — DOXYCYCLINE HYCLATE 100 MG PO TABS: 100.0000 mg | ORAL_TABLET | Freq: Two times a day (BID) | ORAL | 0 refills | Status: DC

## 2017-11-21 MED ORDER — TERBINAFINE HCL 1 % EX SOLN
1.0000 | Freq: Two times a day (BID) | CUTANEOUS | 6 refills | Status: DC
Start: 2017-11-21 — End: 2020-06-09

## 2017-11-21 MED ORDER — CLOTRIMAZOLE 1 % EX CREA: 1.0000 "application " | TOPICAL_CREAM | Freq: Two times a day (BID) | CUTANEOUS | 2 refills | Status: DC

## 2017-11-21 MED ORDER — TRIAMCINOLONE ACETONIDE 0.1 % EX CREA: 1.0000 "application " | TOPICAL_CREAM | Freq: Two times a day (BID) | CUTANEOUS | 1 refills | Status: DC

## 2017-11-21 NOTE — Progress Notes (Signed)
BP 136/88   Pulse 60   Temp 97.9 F (36.6 C) (Oral)   Ht 5\' 8"  (1.727 m)   Wt 210 lb (95.3 kg)   SpO2 98%   BMI 31.93 kg/m    Subjective:    Patient ID: Jesse Diaz, male    DOB: 04-19-1961, 57 y.o.   MRN: [id number]  HPI: Jesse Diaz is a 57 y.o. male  Chief Complaint  Patient presents with  . Rash  . Blisters    all over but worse on left leg per patient   Rash x 2 weeks b/l LEs, worst around ankles. Blisters started a few days after. Itches, burns. No known new exposures but does work outside on a farm. Has been soaking his legs and putting hydrocortisone cream with no relief. Several tick bites the past month. Denies fevers, chills, headaches, joint pains.   When checking BPs at home, getting 130s/80s. Tolerating the medication well. No CP, SOB, dizziness, syncope.    Had right great toenail become dicsolored and fall off recently. New nail is growing underneath. Now has a toe on left foot that is starting to do the same thing. Hasn't tried anything OTC.   On gabapentin for b/l foot pain. Notes improvement at bedtime when taking it. Just taking 1 tab nightly.    Past Medical History:  Diagnosis Date  . Anxiety   . Asthma   . Depression   . GERD (gastroesophageal reflux disease)    in past  . Hypertension   . Sinus drainage   . Wears dentures    partial lower   Social History   Socioeconomic History  . Marital status: Married    Spouse name: Not on file  . Number of children: Not on file  . Years of education: Not on file  . Highest education level: Not on file  Occupational History  . Not on file  Social Needs  . Financial resource strain: Not on file  . Food insecurity:    Worry: Not on file    Inability: Not on file  . Transportation needs:    Medical: Not on file    Non-medical: Not on file  Tobacco Use  . Smoking status: Never Smoker  . Smokeless tobacco: Former Neurosurgeon  . Tobacco comment: quit chew - approx 2008  Substance and Sexual  Activity  . Alcohol use: No  . Drug use: No  . Sexual activity: Not on file  Lifestyle  . Physical activity:    Days per week: Not on file    Minutes per session: Not on file  . Stress: Not on file  Relationships  . Social connections:    Talks on phone: Not on file    Gets together: Not on file    Attends religious service: Not on file    Active member of club or organization: Not on file    Attends meetings of clubs or organizations: Not on file    Relationship status: Not on file  . Intimate partner violence:    Fear of current or ex partner: Not on file    Emotionally abused: Not on file    Physically abused: Not on file    Forced sexual activity: Not on file  Other Topics Concern  . Not on file  Social History Narrative  . Not on file   Relevant past medical, surgical, family and social history reviewed and updated as indicated. Interim medical history since our last visit reviewed.  Allergies and medications reviewed and updated.  Review of Systems  Per HPI unless specifically indicated above     Objective:    BP 136/88   Pulse 60   Temp 97.9 F (36.6 C) (Oral)   Ht 5\' 8"  (1.727 m)   Wt 210 lb (95.3 kg)   SpO2 98%   BMI 31.93 kg/m   Wt Readings from Last 3 Encounters:  11/21/17 210 lb (95.3 kg)  02/28/17 213 lb 3.2 oz (96.7 kg)  08/27/16 204 lb (92.5 kg)    Physical Exam  Constitutional: He is oriented to person, place, and time. He appears well-developed and well-nourished. No distress.  HENT:  Head: Atraumatic.  Eyes: Pupils are equal, round, and reactive to light. Conjunctivae are normal.  Neck: Normal range of motion. Neck supple.  Cardiovascular: Normal rate and regular rhythm.  Pulmonary/Chest: Effort normal and breath sounds normal.  Musculoskeletal: Normal range of motion.  Neurological: He is alert and oriented to person, place, and time.  Skin: Skin is warm.  Sporadic rash across b/l LEs, some lesions scabbed and some large blisters Left  toeneail partially grown out with dark macule underneath nail. Unclear if hematoma or nail discoloration  Psychiatric: He has a normal mood and affect. His behavior is normal.  Nursing note and vitals reviewed.   Results for orders placed or performed in visit on 02/28/17  Ova and parasite examination  Result Value Ref Range   OVA + PARASITE EXAM Final report    O&P result 1 Comment   Microscopic Examination  Result Value Ref Range   WBC, UA None seen 0 - 5 /hpf   RBC, UA None seen 0 - 2 /hpf   Epithelial Cells (non renal) CANCELED    Bacteria, UA None seen None seen/Few  CBC with Differential/Platelet  Result Value Ref Range   WBC 5.0 3.4 - 10.8 x10E3/uL   RBC 5.01 4.14 - 5.80 x10E6/uL   Hemoglobin 15.4 13.0 - 17.7 g/dL   Hematocrit 40.9 81.1 - 51.0 %   MCV 89 79 - 97 fL   MCH 30.7 26.6 - 33.0 pg   MCHC 34.5 31.5 - 35.7 g/dL   RDW 91.4 78.2 - 95.6 %   Platelets 234 150 - 379 x10E3/uL   Neutrophils 52 Not Estab. %   Lymphs 28 Not Estab. %   Monocytes 9 Not Estab. %   Eos 10 Not Estab. %   Basos 1 Not Estab. %   Neutrophils Absolute 2.5 1.4 - 7.0 x10E3/uL   Lymphocytes Absolute 1.4 0.7 - 3.1 x10E3/uL   Monocytes Absolute 0.4 0.1 - 0.9 x10E3/uL   EOS (ABSOLUTE) 0.5 (H) 0.0 - 0.4 x10E3/uL   Basophils Absolute 0.1 0.0 - 0.2 x10E3/uL   Immature Granulocytes 0 Not Estab. %   Immature Grans (Abs) 0.0 0.0 - 0.1 x10E3/uL  Comprehensive metabolic panel  Result Value Ref Range   Glucose 89 65 - 99 mg/dL   BUN 13 6 - 24 mg/dL   Creatinine, Ser 2.13 0.76 - 1.27 mg/dL   GFR calc non Af Amer 85 >59 mL/min/1.73   GFR calc Af Amer 99 >59 mL/min/1.73   BUN/Creatinine Ratio 13 9 - 20   Sodium 142 134 - 144 mmol/L   Potassium 4.6 3.5 - 5.2 mmol/L   Chloride 101 96 - 106 mmol/L   CO2 25 20 - 29 mmol/L   Calcium 9.2 8.7 - 10.2 mg/dL   Total Protein 7.1 6.0 - 8.5 g/dL   Albumin  4.3 3.5 - 5.5 g/dL   Globulin, Total 2.8 1.5 - 4.5 g/dL   Albumin/Globulin Ratio 1.5 1.2 - 2.2   Bilirubin  Total 0.5 0.0 - 1.2 mg/dL   Alkaline Phosphatase 65 39 - 117 IU/L   AST 18 0 - 40 IU/L   ALT 22 0 - 44 IU/L  Lipid Panel w/o Chol/HDL Ratio  Result Value Ref Range   Cholesterol, Total 169 100 - 199 mg/dL   Triglycerides 161178 (H) 0 - 149 mg/dL   HDL 40 >09>39 mg/dL   VLDL Cholesterol Cal 36 5 - 40 mg/dL   LDL Calculated 93 0 - 99 mg/dL  TSH  Result Value Ref Range   TSH 1.520 0.450 - 4.500 uIU/mL  UA/M w/rflx Culture, Routine  Result Value Ref Range   Specific Gravity, UA 1.020 1.005 - 1.030   pH, UA 7.0 5.0 - 7.5   Color, UA Yellow Yellow   Appearance Ur Clear Clear   Leukocytes, UA Negative Negative   Protein, UA 1+ (A) Negative/Trace   Glucose, UA Negative Negative   Ketones, UA Negative Negative   RBC, UA Negative Negative   Bilirubin, UA Negative Negative   Urobilinogen, Ur 0.2 0.2 - 1.0 mg/dL   Nitrite, UA Negative Negative   Microscopic Examination See below:   HIV antibody  Result Value Ref Range   HIV Screen 4th Generation wRfx Non Reactive Non Reactive  Hepatitis C Antibody  Result Value Ref Range   Hep C Virus Ab <0.1 0.0 - 0.9 s/co ratio      Assessment & Plan:   Problem List Items Addressed This Visit      Cardiovascular and Mediastinum   Benign essential hypertension - Primary    Stable and typically WNL with some mildly elevated readings. Continue current regimen for now, will recheck at upcoming f/u and increase if needed       Other Visit Diagnoses    Rash       Unclear etiology. WIll tx with doxycycline to cover tick dz and triamcinolone cream. Elevate legs. F/u if worsening or no improvement   Tinea pedis of both feet       Clotrimazole cream given, change socks and shoes frequently. Powders, sprays to help keep things dry.    Relevant Medications   clotrimazole (LOTRIMIN) 1 % cream   Terbinafine HCl 1 % SOLN   Foot pain, bilateral       Improved with gabapentin nightly. Continue current regimen       Follow up plan: Return in about 3 months  (around 02/21/2018) for CPE after 10/29.

## 2017-11-24 NOTE — Patient Instructions (Signed)
Follow up in 3 months

## 2017-11-24 NOTE — Assessment & Plan Note (Signed)
Stable and typically WNL with some mildly elevated readings. Continue current regimen for now, will recheck at upcoming f/u and increase if needed

## 2017-12-20 DIAGNOSIS — S93311A Subluxation of tarsal joint of right foot, initial encounter: Secondary | ICD-10-CM | POA: Diagnosis not present

## 2017-12-20 DIAGNOSIS — M779 Enthesopathy, unspecified: Secondary | ICD-10-CM | POA: Diagnosis not present

## 2017-12-20 DIAGNOSIS — S93312A Subluxation of tarsal joint of left foot, initial encounter: Secondary | ICD-10-CM | POA: Diagnosis not present

## 2017-12-28 DIAGNOSIS — S93312A Subluxation of tarsal joint of left foot, initial encounter: Secondary | ICD-10-CM | POA: Diagnosis not present

## 2017-12-28 DIAGNOSIS — S93311A Subluxation of tarsal joint of right foot, initial encounter: Secondary | ICD-10-CM | POA: Diagnosis not present

## 2017-12-28 DIAGNOSIS — M779 Enthesopathy, unspecified: Secondary | ICD-10-CM | POA: Diagnosis not present

## 2018-01-04 DIAGNOSIS — M79671 Pain in right foot: Secondary | ICD-10-CM | POA: Diagnosis not present

## 2018-01-04 DIAGNOSIS — M79672 Pain in left foot: Secondary | ICD-10-CM | POA: Diagnosis not present

## 2018-01-28 ENCOUNTER — Other Ambulatory Visit: Payer: Self-pay | Admitting: Family Medicine

## 2018-01-30 NOTE — Telephone Encounter (Signed)
Losartan/hct tab 50-12.5 mg refill Last Refill:11/09/17 # 90 Last OV: 11/21/17  NOV  03/02/18 PCP: [facility]

## 2018-01-31 DIAGNOSIS — G609 Hereditary and idiopathic neuropathy, unspecified: Secondary | ICD-10-CM | POA: Diagnosis not present

## 2018-03-02 ENCOUNTER — Encounter: Payer: Self-pay | Admitting: Family Medicine

## 2018-03-02 ENCOUNTER — Ambulatory Visit (INDEPENDENT_AMBULATORY_CARE_PROVIDER_SITE_OTHER): Payer: 59 | Admitting: Family Medicine

## 2018-03-02 ENCOUNTER — Other Ambulatory Visit: Payer: Self-pay

## 2018-03-02 VITALS — BP 111/76 | HR 54 | Temp 97.9°F | Ht 68.0 in | Wt 200.0 lb

## 2018-03-02 DIAGNOSIS — Z23 Encounter for immunization: Secondary | ICD-10-CM | POA: Diagnosis not present

## 2018-03-02 DIAGNOSIS — I10 Essential (primary) hypertension: Secondary | ICD-10-CM

## 2018-03-02 DIAGNOSIS — Z0000 Encounter for general adult medical examination without abnormal findings: Secondary | ICD-10-CM

## 2018-03-02 DIAGNOSIS — G629 Polyneuropathy, unspecified: Secondary | ICD-10-CM

## 2018-03-02 NOTE — Progress Notes (Signed)
BP 111/76   Pulse (!) 54   Temp 97.9 F (36.6 C) (Oral)   Ht 5\' 8"  (1.727 m)   Wt 200 lb (90.7 kg)   SpO2 97%   BMI 30.41 kg/m    Subjective:    Patient ID: Jesse Diaz, male    DOB: [birth year], 57 y.o.   MRN: [id number]  HPI: Jesse Diaz is a 57 y.o. male presenting on 03/02/2018 for comprehensive medical examination. Current medical complaints include:b/l foot pain  BPs stable and WNL, Taking medicine faithfully without side effects. No CP, SOB, dizziness, HAs.   Neurology did nerve conduction studies showing peripheral neuropathy. Podiatry had been seeing him and tried him on meloxicam which didn't help. Gabapentin wasn't helping at a dose of 300 mg nightly. Awaiting consultation   He currently lives with: Interim Problems from his last visit: no  Depression Screen done today and results listed below:  Depression screen [facility] 2/9 03/02/2018 11/21/2017 02/28/2017  Decreased Interest 1 1 0  Down, Depressed, Hopeless 1 1 0  PHQ - 2 Score 2 2 0  Altered sleeping 1 1 2   Tired, decreased energy 1 1 1   Change in appetite 1 0 1  Feeling bad or failure about yourself  1 0 0  Trouble concentrating 1 0 0  Moving slowly or fidgety/restless 0 - 0  Suicidal thoughts 0 - 0  PHQ-9 Score 7 4 4     The patient does not have a history of falls. I did not complete a risk assessment for falls. A plan of care for falls was not documented.   Past Medical History:  Past Medical History:  Diagnosis Date  . Anxiety   . Asthma   . Depression   . GERD (gastroesophageal reflux disease)    in past  . Hypertension   . Sinus drainage   . Wears dentures    partial lower    Surgical History:  Past Surgical History:  Procedure Laterality Date  . CARDIOVASCULAR STRESS TEST     approx 8 yrs ago - anxiety/reflux  . COLONOSCOPY    . COLONOSCOPY WITH PROPOFOL N/A 04/02/2016   Procedure: COLONOSCOPY WITH PROPOFOL;  Surgeon: Scot Jun, MD;  Location: [facility] ENDOSCOPY;  Service:  Endoscopy;  Laterality: N/A;  . ESOPHAGEAL DILATION     striction  . ESOPHAGOGASTRODUODENOSCOPY (EGD) WITH PROPOFOL N/A 04/02/2016   Procedure: ESOPHAGOGASTRODUODENOSCOPY (EGD) WITH PROPOFOL;  Surgeon: Scot Jun, MD;  Location: [facility] ENDOSCOPY;  Service: Endoscopy;  Laterality: N/A;  . EXTRACORPOREAL SHOCK WAVE LITHOTRIPSY Right 12/25/2015   Procedure: EXTRACORPOREAL SHOCK WAVE LITHOTRIPSY (ESWL);  Surgeon: Orson Ape, MD;  Location: [facility] ORS;  Service: Urology;  Laterality: Right;  . KNEE ARTHROSCOPY Bilateral   . KNEE ARTHROSCOPY    . SEPTOPLASTY N/A 04/04/2015   Procedure: SEPTOPLASTY;  Surgeon: Linus Salmons, MD;  Location[facility]ERY [facility];  Service: ENT;  Laterality: N/A;  . TONSILLECTOMY    . TURBINATE REDUCTION N/A 04/04/2015   Procedure: TURBINATE REDUCTION SMR;  Surgeon: Linus Salmons, MD;  Location: [facility];  Service: ENT;  Laterality: N/A;  . UPPER GI ENDOSCOPY      Medications:  Current Outpatient Medications on File Prior to Visit  Medication Sig  . ammonium lactate (AMLACTIN) 12 % cream Apply topically as needed for dry skin.  . Arginine 500 MG CAPS Take 500 mg by mouth daily.  . Biotin 40981 MCG TABS Take 10,000 mg by mouth daily.  Marland Kitchen CALCIUM CITRATE PO  Take 750 mg by mouth daily.  . clotrimazole (LOTRIMIN) 1 % cream Apply 1 application topically 2 (two) times daily. To feet over scaly places  . co-enzyme Q-10 30 MG capsule Take 30 mg by mouth daily.  Marland Kitchen gabapentin (NEURONTIN) 300 MG capsule Take 1 tab daily x 4 days. If tolerated, can increase to two tabs. If needed after another week, can increase to three tabs daily as needed  . losartan-hydrochlorothiazide (HYZAAR) 50-12.5 MG tablet TAKE 1 TABLET DAILY  . Multiple Vitamin (MULTIVITAMIN) tablet Take 1 tablet by mouth daily.  . Potassium 99 MG TABS Take 99 mg by mouth daily.  . Terbinafine HCl 1 % SOLN Apply 1 application topically 2 (two) times daily. To toenails  . triamcinolone cream  (KENALOG) 0.1 % Apply 1 application topically 2 (two) times daily. Apply to rash on legs   No current facility-administered medications on file prior to visit.     Allergies:  No Known Allergies  Social History:  Social History   Socioeconomic History  . Marital status: Married    Spouse name: Not on file  . Number of children: Not on file  . Years of education: Not on file  . Highest education level: Not on file  Occupational History  . Not on file  Social Needs  . Financial resource strain: Not on file  . Food insecurity:    Worry: Not on file    Inability: Not on file  . Transportation needs:    Medical: Not on file    Non-medical: Not on file  Tobacco Use  . Smoking status: Never Smoker  . Smokeless tobacco: Former Neurosurgeon  . Tobacco comment: quit chew - approx 2008  Substance and Sexual Activity  . Alcohol use: No  . Drug use: No  . Sexual activity: Not on file  Lifestyle  . Physical activity:    Days per week: Not on file    Minutes per session: Not on file  . Stress: Not on file  Relationships  . Social connections:    Talks on phone: Not on file    Gets together: Not on file    Attends religious service: Not on file    Active member of club or organization: Not on file    Attends meetings of clubs or organizations: Not on file    Relationship status: Not on file  . Intimate partner violence:    Fear of current or ex partner: Not on file    Emotionally abused: Not on file    Physically abused: Not on file    Forced sexual activity: Not on file  Other Topics Concern  . Not on file  Social History Narrative  . Not on file   Social History   Tobacco Use  Smoking Status Never Smoker  Smokeless Tobacco Former User  Tobacco Comment   quit chew - approx 2008   Social History   Substance and Sexual Activity  Alcohol Use No    Family History:  Family History  Problem Relation Age of Onset  . Stroke Mother   . Colon polyps Mother   . Hypertension  Mother   . Leukemia Father   . Cancer Father        prostate  . Heart attack Father   . Stroke Father   . Colon polyps Sister   . Cancer Maternal Aunt   . Diabetes Maternal Aunt   . Hypertension Maternal Aunt   . Cancer Maternal Uncle  prostate  . Diabetes Maternal Uncle   . Hypertension Maternal Uncle     Past medical history, surgical history, medications, allergies, family history and social history reviewed with patient today and changes made to appropriate areas of the chart.   Review of Systems - General ROS: negative Psychological ROS: negative Ophthalmic ROS: negative ENT ROS: negative Allergy and Immunology ROS: negative Hematological and Lymphatic ROS: negative Endocrine ROS: negative Breast ROS: negative for breast lumps Respiratory ROS: no cough, shortness of breath, or wheezing Cardiovascular ROS: no chest pain or dyspnea on exertion Gastrointestinal ROS: no abdominal pain, change in bowel habits, or black or bloody stools Genito-Urinary ROS: no dysuria, trouble voiding, or hematuria Musculoskeletal ROS: negative Neurological ROS: b/l foot neuropathy Dermatological ROS: negative All other ROS negative except what is listed above and in the HPI.      Objective:    BP 111/76   Pulse (!) 54   Temp 97.9 F (36.6 C) (Oral)   Ht 5\' 8"  (1.727 m)   Wt 200 lb (90.7 kg)   SpO2 97%   BMI 30.41 kg/m   Wt Readings from Last 3 Encounters:  03/02/18 200 lb (90.7 kg)  11/21/17 210 lb (95.3 kg)  02/28/17 213 lb 3.2 oz (96.7 kg)    Physical Exam  Constitutional: He is oriented to person, place, and time. He appears well-developed and well-nourished. No distress.  HENT:  Head: Atraumatic.  Right Ear: External ear normal.  Left Ear: External ear normal.  Nose: Nose normal.  Mouth/Throat: Oropharynx is clear and moist.  Eyes: Pupils are equal, round, and reactive to light. Conjunctivae are normal. No scleral icterus.  Neck: Normal range of motion. Neck  supple. No thyromegaly present.  Cardiovascular: Normal rate, regular rhythm, normal heart sounds and intact distal pulses.  No murmur heard. Pulmonary/Chest: Effort normal and breath sounds normal. No respiratory distress.  Abdominal: Soft. Bowel sounds are normal. He exhibits no distension and no mass. There is no tenderness. There is no guarding.  Genitourinary: Rectum normal and prostate normal.  Musculoskeletal: Normal range of motion. He exhibits no edema or tenderness.  Lymphadenopathy:    He has no cervical adenopathy.  Neurological: He is alert and oriented to person, place, and time. He has normal reflexes. No cranial nerve deficit.  Skin: Skin is warm and dry. No rash noted.  Psychiatric: He has a normal mood and affect. His behavior is normal.  Nursing note and vitals reviewed.   Results for orders placed or performed in visit on 03/02/18  CBC with Differential/Platelet  Result Value Ref Range   WBC 6.7 3.4 - 10.8 x10E3/uL   RBC 4.98 4.14 - 5.80 x10E6/uL   Hemoglobin 15.4 13.0 - 17.7 g/dL   Hematocrit 96.0 45.4 - 51.0 %   MCV 88 79 - 97 fL   MCH 30.9 26.6 - 33.0 pg   MCHC 35.0 31.5 - 35.7 g/dL   RDW 09.8 11.9 - 14.7 %   Platelets 246 150 - 450 x10E3/uL   Neutrophils 47 Not Estab. %   Lymphs 33 Not Estab. %   Monocytes 11 Not Estab. %   Eos 8 Not Estab. %   Basos 1 Not Estab. %   Neutrophils Absolute 3.0 1.4 - 7.0 x10E3/uL   Lymphocytes Absolute 2.2 0.7 - 3.1 x10E3/uL   Monocytes Absolute 0.8 0.1 - 0.9 x10E3/uL   EOS (ABSOLUTE) 0.6 (H) 0.0 - 0.4 x10E3/uL   Basophils Absolute 0.1 0.0 - 0.2 x10E3/uL   Immature Granulocytes  0 Not Estab. %   Immature Grans (Abs) 0.0 0.0 - 0.1 x10E3/uL  Comprehensive metabolic panel  Result Value Ref Range   Glucose 81 65 - 99 mg/dL   BUN 14 6 - 24 mg/dL   Creatinine, Ser 3.23 0.76 - 1.27 mg/dL   GFR calc non Af Amer 94 >59 mL/min/1.73   GFR calc Af Amer 109 >59 mL/min/1.73   BUN/Creatinine Ratio 15 9 - 20   Sodium 140 134 - 144  mmol/L   Potassium 3.9 3.5 - 5.2 mmol/L   Chloride 100 96 - 106 mmol/L   CO2 24 20 - 29 mmol/L   Calcium 9.1 8.7 - 10.2 mg/dL   Total Protein 6.7 6.0 - 8.5 g/dL   Albumin 4.2 3.5 - 5.5 g/dL   Globulin, Total 2.5 1.5 - 4.5 g/dL   Albumin/Globulin Ratio 1.7 1.2 - 2.2   Bilirubin Total 0.8 0.0 - 1.2 mg/dL   Alkaline Phosphatase 62 39 - 117 IU/L   AST 22 0 - 40 IU/L   ALT 24 0 - 44 IU/L  Lipid Panel w/o Chol/HDL Ratio  Result Value Ref Range   Cholesterol, Total 169 100 - 199 mg/dL   Triglycerides 557 0 - 149 mg/dL   HDL 42 >32 mg/dL   VLDL Cholesterol Cal 23 5 - 40 mg/dL   LDL Calculated 202 (H) 0 - 99 mg/dL  UA/M w/rflx Culture, Routine  Result Value Ref Range   Specific Gravity, UA 1.020 1.005 - 1.030   pH, UA 7.0 5.0 - 7.5   Color, UA Yellow Yellow   Appearance Ur Clear Clear   Leukocytes, UA Negative Negative   Protein, UA Negative Negative/Trace   Glucose, UA Negative Negative   Ketones, UA Negative Negative   RBC, UA Negative Negative   Bilirubin, UA Negative Negative   Urobilinogen, Ur 0.2 0.2 - 1.0 mg/dL   Nitrite, UA Negative Negative  HgB A1c  Result Value Ref Range   Hgb A1c MFr Bld 5.6 4.8 - 5.6 %   Est. average glucose Bld gHb Est-mCnc 114 mg/dL      Assessment & Plan:   Problem List Items Addressed This Visit      Cardiovascular and Mediastinum   Benign essential hypertension - Primary    Stable and WNL, continue current regimen      Relevant Orders   CBC with Differential/Platelet (Completed)   Comprehensive metabolic panel (Completed)   UA/M w/rflx Culture, Routine (Completed)     Nervous and Auditory   Peripheral neuropathy    Not under good control. Awaiting Neurology f/u. Continue gabapentin in meantime      Relevant Orders   HgB A1c (Completed)    Other Visit Diagnoses    Annual physical exam       Relevant Orders   Lipid Panel w/o Chol/HDL Ratio (Completed)   Flu vaccine need       Relevant Orders   Flu Vaccine QUAD 36+ mos IM  (Completed)       Discussed aspirin prophylaxis for myocardial infarction prevention and decision was it was not indicated  LABORATORY TESTING:  Health maintenance labs ordered today as discussed above.   The natural history of prostate cancer and ongoing controversy regarding screening and potential treatment outcomes of prostate cancer has been discussed with the patient. The meaning of a false positive PSA and a false negative PSA has been discussed. He indicates understanding of the limitations of this screening test and wishes not to proceed with  screening PSA testing.   IMMUNIZATIONS:   - Tdap: Tetanus vaccination status reviewed: last tetanus booster within 10 years. - Influenza: Administered today  SCREENING: - Colonoscopy: Up to date  Discussed with patient purpose of the colonoscopy is to detect colon cancer at curable precancerous or early stages   PATIENT COUNSELING:    Sexuality: Discussed sexually transmitted diseases, partner selection, use of condoms, avoidance of unintended pregnancy  and contraceptive alternatives.   Advised to avoid cigarette smoking.  I discussed with the patient that most people either abstain from alcohol or drink within safe limits (<=14/week and <=4 drinks/occasion for males, <=7/weeks and <= 3 drinks/occasion for females) and that the risk for alcohol disorders and other health effects rises proportionally with the number of drinks per week and how often a drinker exceeds daily limits.  Discussed cessation/primary prevention of drug use and availability of treatment for abuse.   Diet: Encouraged to adjust caloric intake to maintain  or achieve ideal body weight, to reduce intake of dietary saturated fat and total fat, to limit sodium intake by avoiding high sodium foods and not adding table salt, and to maintain adequate dietary potassium and calcium preferably from fresh fruits, vegetables, and low-fat dairy products.    stressed the  importance of regular exercise  Injury prevention: Discussed safety belts, safety helmets, smoke detector, smoking near bedding or upholstery.   Dental health: Discussed importance of regular tooth brushing, flossing, and dental visits.   Follow up plan: NEXT PREVENTATIVE PHYSICAL DUE IN 1 YEAR. Return in about 6 months (around 08/31/2018) for BP f/u.

## 2018-03-03 ENCOUNTER — Encounter: Payer: Self-pay | Admitting: Family Medicine

## 2018-03-03 LAB — UA/M W/RFLX CULTURE, ROUTINE
BILIRUBIN UA: NEGATIVE
Glucose, UA: NEGATIVE
KETONES UA: NEGATIVE
Leukocytes, UA: NEGATIVE
NITRITE UA: NEGATIVE
PH UA: 7 (ref 5.0–7.5)
Protein, UA: NEGATIVE
RBC UA: NEGATIVE
SPEC GRAV UA: 1.02 (ref 1.005–1.030)
UUROB: 0.2 mg/dL (ref 0.2–1.0)

## 2018-03-03 LAB — CBC WITH DIFFERENTIAL/PLATELET
BASOS: 1 %
Basophils Absolute: 0.1 10*3/uL (ref 0.0–0.2)
EOS (ABSOLUTE): 0.6 10*3/uL — AB (ref 0.0–0.4)
EOS: 8 %
HEMATOCRIT: 44 % (ref 37.5–51.0)
Hemoglobin: 15.4 g/dL (ref 13.0–17.7)
Immature Grans (Abs): 0 10*3/uL (ref 0.0–0.1)
Immature Granulocytes: 0 %
LYMPHS ABS: 2.2 10*3/uL (ref 0.7–3.1)
Lymphs: 33 %
MCH: 30.9 pg (ref 26.6–33.0)
MCHC: 35 g/dL (ref 31.5–35.7)
MCV: 88 fL (ref 79–97)
MONOS ABS: 0.8 10*3/uL (ref 0.1–0.9)
Monocytes: 11 %
NEUTROS ABS: 3 10*3/uL (ref 1.4–7.0)
NEUTROS PCT: 47 %
PLATELETS: 246 10*3/uL (ref 150–450)
RBC: 4.98 x10E6/uL (ref 4.14–5.80)
RDW: 13.8 % (ref 12.3–15.4)
WBC: 6.7 10*3/uL (ref 3.4–10.8)

## 2018-03-03 LAB — COMPREHENSIVE METABOLIC PANEL
A/G RATIO: 1.7 (ref 1.2–2.2)
ALT: 24 IU/L (ref 0–44)
AST: 22 IU/L (ref 0–40)
Albumin: 4.2 g/dL (ref 3.5–5.5)
Alkaline Phosphatase: 62 IU/L (ref 39–117)
BILIRUBIN TOTAL: 0.8 mg/dL (ref 0.0–1.2)
BUN/Creatinine Ratio: 15 (ref 9–20)
BUN: 14 mg/dL (ref 6–24)
CHLORIDE: 100 mmol/L (ref 96–106)
CO2: 24 mmol/L (ref 20–29)
Calcium: 9.1 mg/dL (ref 8.7–10.2)
Creatinine, Ser: 0.91 mg/dL (ref 0.76–1.27)
GFR calc Af Amer: 109 mL/min/{1.73_m2} (ref >59)
GFR, EST NON AFRICAN AMERICAN: 94 mL/min/{1.73_m2} (ref >59)
GLOBULIN, TOTAL: 2.5 g/dL (ref 1.5–4.5)
Glucose: 81 mg/dL (ref 65–99)
POTASSIUM: 3.9 mmol/L (ref 3.5–5.2)
SODIUM: 140 mmol/L (ref 134–144)
Total Protein: 6.7 g/dL (ref 6.0–8.5)

## 2018-03-03 LAB — LIPID PANEL W/O CHOL/HDL RATIO
Cholesterol, Total: 169 mg/dL (ref 100–199)
HDL: 42 mg/dL (ref >39)
LDL Calculated: 104 mg/dL — ABNORMAL HIGH (ref 0–99)
TRIGLYCERIDES: 117 mg/dL (ref 0–149)
VLDL Cholesterol Cal: 23 mg/dL (ref 5–40)

## 2018-03-03 LAB — HEMOGLOBIN A1C
ESTIMATED AVERAGE GLUCOSE: 114 mg/dL
Hgb A1c MFr Bld: 5.6 % (ref 4.8–5.6)

## 2018-03-05 NOTE — Assessment & Plan Note (Signed)
Stable and WNL, continue current regimen 

## 2018-03-05 NOTE — Assessment & Plan Note (Signed)
Not under good control. Awaiting Neurology f/u. Continue gabapentin in meantime

## 2018-03-07 ENCOUNTER — Other Ambulatory Visit: Payer: Self-pay | Admitting: Family Medicine

## 2018-03-07 MED ORDER — GABAPENTIN 300 MG PO CAPS: ORAL_CAPSULE | ORAL | 0 refills | Status: DC

## 2018-03-07 NOTE — Telephone Encounter (Signed)
Copied from CRM [id number]. Topic: Quick Communication - Rx Refill/Question >> Mar 07, 2018 11:04 AM Arlyss Gandy, NT wrote: Medication: gabapentin (NEURONTIN) 300 MG capsule  Has the patient contacted their pharmacy? Yes.   (Agent: If no, request that the patient contact the pharmacy for the refill.) (Agent: If yes, when and what did the pharmacy advise?)  Preferred Pharmacy (with phone number or street name): [phone] [place], [place] - [address] [phone] (Phone) [phone] (Fax)    Agent: Please be advised that RX refills may take up to 3 business days. We ask that you follow-up with your pharmacy.

## 2018-03-07 NOTE — Telephone Encounter (Signed)
Requested Prescriptions  Pending Prescriptions Disp Refills  . gabapentin (NEURONTIN) 300 MG capsule 90 capsule 0    Sig: Take 1 tab daily x 4 days. If tolerated, can increase to two tabs. If needed after another week, can increase to three tabs daily as needed     Neurology: Anticonvulsants - gabapentin Passed - 03/07/2018 11:08 AM      Passed - Valid encounter within last 12 months    Recent Outpatient Visits          5 days ago Benign essential hypertension   [facility] Roosvelt Maser [place], [place]   3 months ago Benign essential hypertension   [facility] Particia Nearing, [place]   1 year ago Essential hypertension   [facility] Particia [place], [place]   1 year ago Encounter to establish care   [facility], Salley Hews, [place]      Future Appointments            In 5 months Maurice March, [place], PA-C [facility]

## 2018-04-03 ENCOUNTER — Other Ambulatory Visit: Payer: Self-pay | Admitting: Family Medicine

## 2018-04-05 DIAGNOSIS — J069 Acute upper respiratory infection, unspecified: Secondary | ICD-10-CM | POA: Diagnosis not present

## 2018-04-05 DIAGNOSIS — J019 Acute sinusitis, unspecified: Secondary | ICD-10-CM | POA: Diagnosis not present

## 2018-04-05 DIAGNOSIS — B9789 Other viral agents as the cause of diseases classified elsewhere: Secondary | ICD-10-CM | POA: Diagnosis not present

## 2018-04-19 ENCOUNTER — Other Ambulatory Visit: Payer: Self-pay | Admitting: Family Medicine

## 2018-04-19 NOTE — Telephone Encounter (Signed)
Requested Prescriptions  Pending Prescriptions Disp Refills  . losartan-hydrochlorothiazide (HYZAAR) 50-12.5 MG tablet [Pharmacy Med Name: LOSARTAN/HCT TAB 50-12.5] 90 tablet 1    Sig: TAKE 1 TABLET DAILY     Cardiovascular: ARB + Diuretic Combos Passed - 04/19/2018 12:17 PM      Passed - K in normal range and within 180 days    Potassium  Date Value Ref Range Status  03/02/2018 3.9 3.5 - 5.2 mmol/L Final         Passed - Na in normal range and within 180 days    Sodium  Date Value Ref Range Status  03/02/2018 140 134 - 144 mmol/L Final         Passed - Cr in normal range and within 180 days    Creatinine, Ser  Date Value Ref Range Status  03/02/2018 0.91 0.76 - 1.27 mg/dL Final         Passed - Ca in normal range and within 180 days    Calcium  Date Value Ref Range Status  03/02/2018 9.1 8.7 - 10.2 mg/dL Final         Passed - Patient is not pregnant      Passed - Last BP in normal range    BP Readings from Last 1 Encounters:  03/02/18 111/76         Passed - Valid encounter within last 6 months    Recent Outpatient Visits          1 month ago [facility]ign essential hypertension   [facility], [place]   4 months ago [facility]ign essential hypertension   [facility], [place]   1 year ago Essential hypertension   [facility] Particia NearingLane, [place], [place]   1 year ago [facility]counter to establish care   [facility], Sal[place]ws[place], [place]      Future Appointments            In 4 months Maurice MarchLane, Salley Hewsachel [place], [facility]

## 2018-05-24 ENCOUNTER — Other Ambulatory Visit: Payer: Self-pay | Admitting: Family Medicine

## 2018-06-15 DIAGNOSIS — J0100 Acute maxillary sinusitis, unspecified: Secondary | ICD-10-CM | POA: Diagnosis not present

## 2018-06-15 DIAGNOSIS — R202 Paresthesia of skin: Secondary | ICD-10-CM | POA: Diagnosis not present

## 2018-06-15 DIAGNOSIS — E559 Vitamin D deficiency, unspecified: Secondary | ICD-10-CM | POA: Diagnosis not present

## 2018-06-15 DIAGNOSIS — E538 Deficiency of other specified B group vitamins: Secondary | ICD-10-CM | POA: Diagnosis not present

## 2018-06-15 DIAGNOSIS — R200 Anesthesia of skin: Secondary | ICD-10-CM | POA: Diagnosis not present

## 2018-06-20 DIAGNOSIS — R202 Paresthesia of skin: Secondary | ICD-10-CM | POA: Diagnosis not present

## 2018-06-20 DIAGNOSIS — R200 Anesthesia of skin: Secondary | ICD-10-CM | POA: Diagnosis not present

## 2018-06-23 DIAGNOSIS — G5603 Carpal tunnel syndrome, bilateral upper limbs: Secondary | ICD-10-CM | POA: Diagnosis not present

## 2018-07-13 ENCOUNTER — Telehealth: Payer: Self-pay | Admitting: Family Medicine

## 2018-07-13 NOTE — Telephone Encounter (Signed)
Copied from CRM [id number]. Topic: Quick Communication - See Telephone Encounter >> Jul 13, 2018  1:42 PM Angela Nevin wrote: CRM for notification. See Telephone encounter for: 07/13/18.   Richard, Pharmacy Tech with [facility], states that patients medication losartan-hydrochlorothiazide (HYZAAR) 50-12.5 MG tablet is on back order. Richard states that pharmacy has medication in separate tablets and inquired if that would be preferred or if an alternate medication can be sent in. Please advise.  [facility]# [phone] Reference # [id number]

## 2018-07-14 MED ORDER — LOSARTAN POTASSIUM 50 MG PO TABS: 50.0000 mg | ORAL_TABLET | Freq: Every day | ORAL | 1 refills | Status: DC

## 2018-07-14 MED ORDER — HYDROCHLOROTHIAZIDE 12.5 MG PO TABS: 12.5000 mg | ORAL_TABLET | Freq: Every day | ORAL | 1 refills | Status: DC

## 2018-07-14 NOTE — Telephone Encounter (Signed)
Called and left a message letting patient know that this was sent in.

## 2018-07-14 NOTE — Telephone Encounter (Signed)
Separate components sent in. Please let pt know this will be in place of the combo pill until they're back in stock

## 2018-08-30 ENCOUNTER — Ambulatory Visit: Payer: 59 | Admitting: Family Medicine

## 2018-11-14 ENCOUNTER — Other Ambulatory Visit: Payer: Self-pay | Admitting: Family Medicine

## 2018-11-14 NOTE — Telephone Encounter (Signed)
Requested medication (s) are due for refill today: yes  Requested medication (s) are on the active medication list: yes  Last refill:  04/19/18  Future visit scheduled:yes  Notes to clinic:  Pt has rx for combo and for individual medications. Which should be ordered. Different pharmacies per each.    Requested Prescriptions  Pending Prescriptions Disp Refills   losartan-hydrochlorothiazide (HYZAAR) 50-12.5 MG tablet [Pharmacy Med Name: LOSARTAN/HCT TAB 50-12.5] 30 tablet 0    Sig: TAKE 1 TABLET DAILY     Cardiovascular: ARB + Diuretic Combos Failed - 11/14/2018  6:07 AM      Failed - K in normal range and within 180 days    Potassium  Date Value Ref Range Status  03/02/2018 3.9 3.5 - 5.2 mmol/L Final         Failed - Na in normal range and within 180 days    Sodium  Date Value Ref Range Status  03/02/2018 140 134 - 144 mmol/L Final         Failed - Cr in normal range and within 180 days    Creatinine, Ser  Date Value Ref Range Status  03/02/2018 0.91 0.76 - 1.27 mg/dL Final         Failed - Ca in normal range and within 180 days    Calcium  Date Value Ref Range Status  03/02/2018 9.1 8.7 - 10.2 mg/dL Final         Failed - Valid encounter within last 6 months    Recent Outpatient Visits          8 months ago Benign essential hypertension   [facility] Merrie Roof [place], [place]   11 months ago Benign essential hypertension   [facility] Volney American, [place]   1 year ago Essential hypertension   [facility] Volney American, [place]   2 years ago Encounter to establish care   [facility], Lilia Argue, [place]      Future Appointments            In 4 weeks Volney American, PA-C [facility], [place] - Patient is not pregnant      Passed - Last BP in normal range    BP Readings from Last 1 Encounters:  03/02/18 111/76

## 2018-12-06 ENCOUNTER — Other Ambulatory Visit: Payer: Self-pay | Admitting: Family Medicine

## 2018-12-06 NOTE — Telephone Encounter (Signed)
Requested Prescriptions  Pending Prescriptions Disp Refills  . losartan-hydrochlorothiazide (HYZAAR) 50-12.5 MG tablet [Pharmacy Med Name: LOSARTAN/HCT TAB 50-12.5] 30 tablet 0    Sig: TAKE 1 TABLET DAILY     Cardiovascular: ARB + Diuretic Combos Failed - 12/06/2018  6:44 AM      Failed - K in normal range and within 180 days    Potassium  Date Value Ref Range Status  03/02/2018 3.9 3.5 - 5.2 mmol/L Final         Failed - Na in normal range and within 180 days    Sodium  Date Value Ref Range Status  03/02/2018 140 134 - 144 mmol/L Final         Failed - Cr in normal range and within 180 days    Creatinine, Ser  Date Value Ref Range Status  03/02/2018 0.91 0.76 - 1.27 mg/dL Final         Failed - Ca in normal range and within 180 days    Calcium  Date Value Ref Range Status  03/02/2018 9.1 8.7 - 10.2 mg/dL Final         Failed - Valid encounter within last 6 months    Recent Outpatient Visits          9 months ago Benign essential hypertension   [facility] Volney American, [place]   1 year ago Benign essential hypertension   [facility] Volney American, [place]   1 year ago Essential hypertension   [facility] Volney American, [place]   2 years ago Encounter to establish care   [facility], Lilia Argue, [place]      Future Appointments            In 1 week Volney American, [place], [place] - Patient is not pregnant      Passed - Last BP in normal range    BP Readings from Last 1 Encounters:  03/02/18 111/76         Patient has follow up appointment 12/13/2018,

## 2018-12-13 ENCOUNTER — Encounter: Payer: Self-pay | Admitting: Family Medicine

## 2018-12-13 ENCOUNTER — Other Ambulatory Visit: Payer: Self-pay

## 2018-12-13 ENCOUNTER — Ambulatory Visit: Payer: 59 | Admitting: Family Medicine

## 2018-12-13 VITALS — BP 135/85 | HR 61 | Temp 97.6°F

## 2018-12-13 DIAGNOSIS — R0602 Shortness of breath: Secondary | ICD-10-CM

## 2018-12-13 DIAGNOSIS — I10 Essential (primary) hypertension: Secondary | ICD-10-CM

## 2018-12-13 DIAGNOSIS — G629 Polyneuropathy, unspecified: Secondary | ICD-10-CM | POA: Diagnosis not present

## 2018-12-13 MED ORDER — ALBUTEROL SULFATE HFA 108 (90 BASE) MCG/ACT IN AERS: 2.0000 | INHALATION_SPRAY | Freq: Four times a day (QID) | RESPIRATORY_TRACT | 0 refills | Status: DC | PRN

## 2018-12-13 MED ORDER — LOSARTAN POTASSIUM-HCTZ 50-12.5 MG PO TABS: 1.0000 | ORAL_TABLET | Freq: Every day | ORAL | 1 refills | Status: DC

## 2018-12-13 NOTE — Progress Notes (Signed)
BP 135/85 (BP Location: Left Arm, Patient Position: Sitting, Cuff Size: Normal)   Pulse 61   Temp 97.6 F (36.4 C) (Oral)   SpO2 99%    Subjective:    Patient ID: Jesse Diaz, male    DOB: 01/19/1961, 58 y.o.   MRN: [id number]  HPI: Jesse Diaz is a [age] y.o. male  Chief Complaint  Patient presents with  . Hypertension    Follow-up  . COVID Positive    Patient COVID positive in June. Still having Difficulty breathing, SOB, and sinus/nasal congestion.   . Shortness of Breath  . Respiratory Distress  . Nasal Congestion  . Sinusitis   HTN - Not regularly checking home BPs but WNL when checked. Taking medication faithfully without side effects. Denies CP, SOB, HAs, dizziness.   Diagnosed in June with COVID 19, had about 2 weeks of sxs (fever, chills, body aches, cough, SOB, weakness, diarrhea, fatigue). Feeling better now overall but still quicker to fatigue and having some lingering SOB. Sxs have stayed about the same since recovering from the virus. Not taking anything for relief.   Has gained some weight the past few months since being away for job training, cutting back with his diet and plans to be more active.   Relevant past medical, surgical, family and social history reviewed and updated as indicated. Interim medical history since our last visit reviewed. Allergies and medications reviewed and updated.  Review of Systems  Per HPI unless specifically indicated above     Objective:    BP 135/85 (BP Location: Left Arm, Patient Position: Sitting, Cuff Size: Normal)   Pulse 61   Temp 97.6 F (36.4 C) (Oral)   SpO2 99%   Wt Readings from Last 3 Encounters:  03/02/18 200 lb (90.7 kg)  11/21/17 210 lb (95.3 kg)  02/28/17 213 lb 3.2 oz (96.7 kg)    Physical Exam Vitals signs and nursing note reviewed.  Constitutional:      Appearance: Normal appearance.  HENT:     Head: Atraumatic.  Eyes:     Extraocular Movements: Extraocular movements intact.   Conjunctiva/sclera: Conjunctivae normal.  Neck:     Musculoskeletal: Normal range of motion and neck supple.  Cardiovascular:     Rate and Rhythm: Normal rate and regular rhythm.     Heart sounds: Normal heart sounds.  Pulmonary:     Effort: Pulmonary effort is normal.     Breath sounds: Normal breath sounds. No wheezing or rales.  Musculoskeletal: Normal range of motion.  Skin:    General: Skin is warm and dry.  Neurological:     General: No focal deficit present.     Mental Status: He is oriented to person, place, and time.  Psychiatric:        Mood and Affect: Mood normal.        Thought Content: Thought content normal.        Judgment: Judgment normal.     Results for orders placed or performed in visit on 12/13/18  Basic metabolic panel  Result Value Ref Range   Glucose 95 65 - 99 mg/dL   BUN 14 6 - 24 mg/dL   Creatinine, Ser 4.090.74 (L) 0.76 - 1.27 mg/dL   GFR calc non Af Amer 102 >59 mL/min/1.73   GFR calc Af Amer 118 >59 mL/min/1.73   BUN/Creatinine Ratio 19 9 - 20   Sodium 140 134 - 144 mmol/L   Potassium 4.4 3.5 - 5.2 mmol/L  Chloride 99 96 - 106 mmol/L   CO2 29 20 - 29 mmol/L   Calcium 9.3 8.7 - 10.2 mg/dL      Assessment & Plan:   Problem List Items Addressed This Visit      Cardiovascular and Mediastinum   Benign essential hypertension - Primary    BPs stable and WNL, continue current regimen      Relevant Medications   losartan-hydrochlorothiazide (HYZAAR) 50-12.5 MG tablet   Other Relevant Orders   Basic metabolic panel (Completed)     Nervous and Auditory   Peripheral neuropathy    Stable on cymbalta, continue current regimen      Relevant Medications   DULoxetine (CYMBALTA) 60 MG capsule    Other Visit Diagnoses    SOB (shortness of breath)       Mild, persistent since COVID 19 infection 2 months ago. Will tx with albuterol prn, work on weight loss and conditioning. Exam and VSS todayF/u if sxs worsening       Follow up plan: Return in  about 6 months (around 06/15/2019) for CPE.

## 2018-12-14 ENCOUNTER — Encounter: Payer: Self-pay | Admitting: Family Medicine

## 2018-12-14 LAB — BASIC METABOLIC PANEL
BUN/Creatinine Ratio: 19 (ref 9–20)
BUN: 14 mg/dL (ref 6–24)
CO2: 29 mmol/L (ref 20–29)
Calcium: 9.3 mg/dL (ref 8.7–10.2)
Chloride: 99 mmol/L (ref 96–106)
Creatinine, Ser: 0.74 mg/dL — ABNORMAL LOW (ref 0.76–1.27)
GFR calc Af Amer: 118 mL/min/{1.73_m2} (ref >59)
GFR calc non Af Amer: 102 mL/min/{1.73_m2} (ref >59)
Glucose: 95 mg/dL (ref 65–99)
Potassium: 4.4 mmol/L (ref 3.5–5.2)
Sodium: 140 mmol/L (ref 134–144)

## 2018-12-18 NOTE — Assessment & Plan Note (Signed)
BPs stable and WNL, continue current regimen 

## 2018-12-18 NOTE — Assessment & Plan Note (Signed)
Stable on cymbalta, continue current regimen 

## 2018-12-29 ENCOUNTER — Other Ambulatory Visit: Payer: Self-pay | Admitting: Family Medicine

## 2018-12-30 ENCOUNTER — Other Ambulatory Visit: Payer: Self-pay | Admitting: Family Medicine

## 2019-01-25 ENCOUNTER — Other Ambulatory Visit: Payer: Self-pay | Admitting: Family Medicine

## 2019-02-11 ENCOUNTER — Other Ambulatory Visit: Payer: Self-pay | Admitting: Family Medicine

## 2019-02-16 DIAGNOSIS — G5603 Carpal tunnel syndrome, bilateral upper limbs: Secondary | ICD-10-CM | POA: Insufficient documentation

## 2019-06-10 ENCOUNTER — Other Ambulatory Visit: Payer: Self-pay | Admitting: Family Medicine

## 2019-06-10 NOTE — Telephone Encounter (Signed)
Requested Prescriptions  Pending Prescriptions Disp Refills  . losartan-hydrochlorothiazide (HYZAAR) 50-12.5 MG tablet [Pharmacy Med Name: LOSARTAN/HCT TAB 50-12.5] 90 tablet 0    Sig: TAKE 1 TABLET DAILY     Cardiovascular: ARB + Diuretic Combos Failed - 06/10/2019  8:17 AM      Failed - Cr in normal range and within 180 days    Creatinine, Ser  Date Value Ref Range Status  12/13/2018 0.74 (L) 0.76 - 1.27 mg/dL Final         Passed - K in normal range and within 180 days    Potassium  Date Value Ref Range Status  12/13/2018 4.4 3.5 - 5.2 mmol/L Final         Passed - Na in normal range and within 180 days    Sodium  Date Value Ref Range Status  12/13/2018 140 134 - 144 mmol/L Final         Passed - Ca in normal range and within 180 days    Calcium  Date Value Ref Range Status  12/13/2018 9.3 8.7 - 10.2 mg/dL Final   Calcium, Ion  Date Value Ref Range Status  05/10/2015 1.13 1.12 - 1.23 mmol/L Final         Passed - Patient is not pregnant      Passed - Last BP in normal range    BP Readings from Last 1 Encounters:  12/13/18 135/85         Passed - Valid encounter within last 6 months    Recent Outpatient Visits          5 months ago Benign essential hypertension   [facility] [place], [place]   1 year ago Benign essential hypertension   [facility] [place], [place]   1 year ago Benign essential hypertension   [facility] [place], [place]   2 years ago Essential hypertension   [facility] [place], [place]   2 years ago Encounter to establish care   [facility], [place], [place]

## 2019-07-30 ENCOUNTER — Telehealth: Payer: Self-pay | Admitting: Family Medicine

## 2019-07-30 NOTE — Telephone Encounter (Signed)
Called pt he states that he wants his intestines checked. Advised per Apolonio Schneiders that he can get a stool kit at appt Friday. Pt verbalized understanding   Copied from [place] [phone]. Topic: General - Other >> Jul 30, 2019 12:36 PM Greggory Keen D wrote: Reason for CRM:  pt called saying he has an appt for a PE on Friday and he wants to know if he wants to be checked for parasites what does he need to do?  Call back # [phone]

## 2019-08-03 ENCOUNTER — Encounter: Payer: 59 | Admitting: Nurse Practitioner

## 2019-09-05 ENCOUNTER — Encounter: Payer: 59 | Admitting: Family Medicine

## 2019-09-08 ENCOUNTER — Other Ambulatory Visit: Payer: Self-pay | Admitting: Family Medicine

## 2019-09-08 NOTE — Telephone Encounter (Signed)
Courtesy refill Requested Prescriptions  Pending Prescriptions Disp Refills  . losartan-hydrochlorothiazide (HYZAAR) 50-12.5 MG tablet [Pharmacy Med Name: LOSARTAN/HCT TAB 50-12.5] 90 tablet 0    Sig: TAKE 1 TABLET DAILY     Cardiovascular: ARB + Diuretic Combos Failed - 09/08/2019  8:21 AM      Failed - K in normal range and within 180 days    Potassium  Date Value Ref Range Status  12/13/2018 4.4 3.5 - 5.2 mmol/L Final         Failed - Na in normal range and within 180 days    Sodium  Date Value Ref Range Status  12/13/2018 140 134 - 144 mmol/L Final         Failed - Cr in normal range and within 180 days    Creatinine, Ser  Date Value Ref Range Status  12/13/2018 0.74 (L) 0.76 - 1.27 mg/dL Final         Failed - Ca in normal range and within 180 days    Calcium  Date Value Ref Range Status  12/13/2018 9.3 8.7 - 10.2 mg/dL Final   Calcium, Ion  Date Value Ref Range Status  05/10/2015 1.13 1.12 - 1.23 mmol/L Final         Failed - Valid encounter within last 6 months    Recent Outpatient Visits          8 months ago Benign essential hypertension   [facility] [place], [place]   1 year ago Benign essential hypertension   [facility] [place], [place]   1 year ago Benign essential hypertension   [facility] [place], [place]   2 years ago Essential hypertension   [facility] [place], [place]   3 years ago Encounter to establish care   [facility], [place], [place]      Future Appointments            In 1 month Maurice March, [place], PA-C [facility], [facility]           Passed - Patient is not pregnant      Passed - Last BP in normal range    BP Readings from Last 1 Encounters:  12/13/18 135/85

## 2019-10-22 ENCOUNTER — Encounter: Payer: Self-pay | Admitting: Family Medicine

## 2019-10-22 ENCOUNTER — Other Ambulatory Visit: Payer: Self-pay

## 2019-10-22 ENCOUNTER — Ambulatory Visit (INDEPENDENT_AMBULATORY_CARE_PROVIDER_SITE_OTHER): Payer: 59 | Admitting: Family Medicine

## 2019-10-22 VITALS — BP 120/86 | HR 55 | Temp 98.0°F | Ht 69.0 in | Wt 224.0 lb

## 2019-10-22 DIAGNOSIS — Z136 Encounter for screening for cardiovascular disorders: Secondary | ICD-10-CM

## 2019-10-22 DIAGNOSIS — I10 Essential (primary) hypertension: Secondary | ICD-10-CM | POA: Diagnosis not present

## 2019-10-22 DIAGNOSIS — Z118 Encounter for screening for other infectious and parasitic diseases: Secondary | ICD-10-CM | POA: Diagnosis not present

## 2019-10-22 DIAGNOSIS — Z0000 Encounter for general adult medical examination without abnormal findings: Secondary | ICD-10-CM

## 2019-10-22 DIAGNOSIS — G629 Polyneuropathy, unspecified: Secondary | ICD-10-CM | POA: Diagnosis not present

## 2019-10-22 LAB — UA/M W/RFLX CULTURE, ROUTINE
Bilirubin, UA: NEGATIVE
Glucose, UA: NEGATIVE
Ketones, UA: NEGATIVE
Leukocytes,UA: NEGATIVE
Nitrite, UA: NEGATIVE
Protein,UA: NEGATIVE
RBC, UA: NEGATIVE
Specific Gravity, UA: 1.02 (ref 1.005–1.030)
Urobilinogen, Ur: 0.2 mg/dL (ref 0.2–1.0)
pH, UA: 7 (ref 5.0–7.5)

## 2019-10-22 MED ORDER — LOSARTAN POTASSIUM-HCTZ 50-12.5 MG PO TABS: 1.0000 | ORAL_TABLET | Freq: Every day | ORAL | 1 refills | Status: DC

## 2019-10-22 NOTE — Progress Notes (Signed)
BP 120/86   Pulse (!) 55   Temp 98 F (36.7 C) (Oral)   Ht 5\' 9"  (1.753 m)   Wt 224 lb (101.6 kg)   SpO2 98%   BMI 33.08 kg/m    Subjective:    Patient ID: , male    DOB: Jan 31, 1961, 59 y.o.   MRN: [id number]  HPI: Jesse Diaz is a 59 y.o. male presenting on 10/22/2019 for comprehensive medical examination. Current medical complaints include:see below  HTN - Home BPs running 130s/80s typically. Under a lot of stress with family heatlh issues currently and feels this is running things up a bit. Taking his medication faithfully without side effects. Denies CP, SOB, HAs, dizziness. Staying active, eating healthy diet.   Neuropathy - Following with Neurology for this. Started on lyrica which was recently increased to TID which does seem to be helping. Also started tart cherry from advice of a friend which he states has helped his hand pain tremendously.   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 Asymptomatic.   He currently lives with: Interim Problems from his last visit: no  Depression Screen done today and results listed below:  Depression screen [facility] 2/9 12/19/2018 03/02/2018 11/21/2017 02/28/2017  Decreased Interest 0 1 1 0  Down, Depressed, Hopeless 0 1 1 0  PHQ - 2 Score 0 2 2 0  Altered sleeping 1 1 1 2   Tired, decreased energy 2 1 1 1   Change in appetite 1 1 0 1  Feeling bad or failure about yourself  0 1 0 0  Trouble concentrating 0 1 0 0  Moving slowly or fidgety/restless 0 0 - 0  Suicidal thoughts 0 0 - 0  PHQ-9 Score 4 7 4 4     The patient does not have a history of falls. I did complete a risk assessment for falls. A plan of care for falls was documented.   Past Medical History:  Past Medical History:  Diagnosis Date  . Anxiety   . Asthma   . Depression   . GERD (gastroesophageal reflux disease)    in past  . Hypertension   . Sinus drainage   . Wears dentures     partial lower    Surgical History:  Past Surgical History:  Procedure Laterality Date  . CARDIOVASCULAR STRESS TEST     approx 8 yrs ago - anxiety/reflux  . COLONOSCOPY    . COLONOSCOPY WITH PROPOFOL N/A 04/02/2016   Procedure: COLONOSCOPY WITH PROPOFOL;  Surgeon: , MD;  Location: [facility] ENDOSCOPY;  Service: Endoscopy;  Laterality: N/A;  . ESOPHAGEAL DILATION     striction  . ESOPHAGOGASTRODUODENOSCOPY (EGD) WITH PROPOFOL N/A 04/02/2016   Procedure: ESOPHAGOGASTRODUODENOSCOPY (EGD) WITH PROPOFOL;  Surgeon: 14/05/2015, MD;  Location: [facility] ENDOSCOPY;  Service: Endoscopy;  Laterality: N/A;  . EXTRACORPOREAL SHOCK WAVE LITHOTRIPSY Right 12/25/2015   Procedure: EXTRACORPOREAL SHOCK WAVE LITHOTRIPSY (ESWL);  Surgeon: 14/05/2015, MD;  Location: [facility] ORS;  Service: Urology;  Laterality: Right;  . KNEE ARTHROSCOPY Bilateral   . KNEE ARTHROSCOPY    . SEPTOPLASTY N/A 04/04/2015   Procedure: SEPTOPLASTY;  Surgeon: [facility], MD;  Location: [facility]GERY [facility];  Service: ENT;  Laterality: N/A;  . TONSILLECTOMY    . TURBINATE REDUCTION N/A 04/04/2015   Procedure: TURBINATE REDUCTION SMR;  Surgeon: 14/06/2014, MD;  Location: [facility];  Service: ENT;  Laterality: N/A;  . UPPER  GI ENDOSCOPY      Medications:  Current Outpatient Medications on File Prior to Visit  Medication Sig  . albuterol (VENTOLIN HFA) 108 (90 Base) MCG/ACT inhaler TAKE 2 PUFFS BY MOUTH EVERY 6 HOURS AS NEEDED FOR WHEEZE OR SHORTNESS OF BREATH  . ammonium lactate (AMLACTIN) 12 % cream Apply topically as needed for dry skin.  . Biotin 40973 MCG TABS Take 10,000 mg by mouth daily.  Marland Kitchen CALCIUM CITRATE PO Take 750 mg by mouth daily.  . clotrimazole (LOTRIMIN) 1 % cream Apply 1 application topically 2 (two) times daily. To feet over scaly places  . co-enzyme Q-10 30 MG capsule Take 30 mg by mouth daily.  . DULoxetine (CYMBALTA) 60 MG capsule Take 1 capsule by mouth daily.  . pregabalin  (LYRICA) 50 MG capsule Take by mouth 3 (three) times daily. Per neurology  . Terbinafine HCl 1 % SOLN Apply 1 application topically 2 (two) times daily. To toenails  . TURMERIC PO Take by mouth.  . Potassium 99 MG TABS Take 99 mg by mouth daily. (Patient not taking: Reported on 10/22/2019)   No current facility-administered medications on file prior to visit.    Allergies:  No Known Allergies  Social History:  Social History   Socioeconomic History  . Marital status: Married    Spouse name: Not on file  . Number of children: Not on file  . Years of education: Not on file  . Highest education level: Not on file  Occupational History  . Not on file  Tobacco Use  . Smoking status: Never Smoker  . Smokeless tobacco: Former Neurosurgeon  . Tobacco comment: quit chew - approx 2008  Vaping Use  . Vaping Use: Never used  Substance and Sexual Activity  . Alcohol use: No  . Drug use: No  . Sexual activity: Not on file  Other Topics Concern  . Not on file  Social History Narrative  . Not on file   Social Determinants of Health   Financial Resource Strain:   . Difficulty of Paying Living Expenses:   Food Insecurity:   . Worried About Programme researcher, broadcasting/film/video in the Last Year:   . Barista in the Last Year:   Transportation Needs:   . Freight forwarder (Medical):   Marland Kitchen Lack of Transportation (Non-Medical):   Physical Activity:   . Days of Exercise per Week:   . Minutes of Exercise per Session:   Stress:   . Feeling of Stress :   Social Connections:   . Frequency of Communication with Friends and Family:   . Frequency of Social Gatherings with Friends and Family:   . Attends Religious Services:   . Active Member of Clubs or Organizations:   . Attends Banker Meetings:   Marland Kitchen Marital Status:   Intimate Partner Violence:   . Fear of Current or Ex-Partner:   . Emotionally Abused:   Marland Kitchen Physically Abused:   . Sexually Abused:    Social History   Tobacco Use    Smoking Status Never Smoker  Smokeless Tobacco Former Neurosurgeon  Tobacco Comment   quit chew - approx 2008   Social History   Substance and Sexual Activity  Alcohol Use No    Family History:  Family History  Problem Relation Age of Onset  . Stroke Mother   . Colon polyps Mother   . Hypertension Mother   . Leukemia Father   . Cancer Father  prostate  . Heart attack Father   . Stroke Father   . Colon polyps Sister   . Cancer Maternal Aunt   . Diabetes Maternal Aunt   . Hypertension Maternal Aunt   . Cancer Maternal Uncle        prostate  . Diabetes Maternal Uncle   . Hypertension Maternal Uncle     Past medical history, surgical history, medications, allergies, family history and social history reviewed with patient today and changes made to appropriate areas of the chart.   Review of Systems - General ROS: negative Psychological ROS: negative Ophthalmic ROS: negative ENT ROS: negative Allergy and Immunology ROS: negative Hematological and Lymphatic ROS: negative Endocrine ROS: negative Breast ROS: negative for breast lumps Respiratory ROS: no cough, shortness of breath, or wheezing Cardiovascular ROS: no chest pain or dyspnea on exertion Gastrointestinal ROS: no abdominal pain, change in bowel habits, or black or bloody stools negative Genito-Urinary ROS: no dysuria, trouble voiding, or hematuria Musculoskeletal ROS: negative Neurological ROS: no TIA or stroke symptoms Dermatological ROS: negative All other ROS negative except what is listed above and in the HPI.      Objective:    BP 120/86   Pulse (!) 55   Temp 98 F (36.7 C) (Oral)   Ht 5\' 9"  (1.753 m)   Wt 224 lb (101.6 kg)   SpO2 98%   BMI 33.08 kg/m   Wt Readings from Last 3 Encounters:  10/22/19 224 lb (101.6 kg)  03/02/18 200 lb (90.7 kg)  11/21/17 210 lb (95.3 kg)    Physical Exam Vitals and nursing note reviewed.  Constitutional:      General: He is not in acute distress.     Appearance: He is well-developed.  HENT:     Head: Atraumatic.     Right Ear: Tympanic membrane and external ear normal.     Left Ear: Tympanic membrane and external ear normal.     Nose: Nose normal.     Mouth/Throat:     Mouth: Mucous membranes are moist.     Pharynx: Oropharynx is clear.  Eyes:     General: No scleral icterus.    Conjunctiva/sclera: Conjunctivae normal.     Pupils: Pupils are equal, round, and reactive to light.  Cardiovascular:     Rate and Rhythm: Normal rate and regular rhythm.     Heart sounds: Normal heart sounds. No murmur heard.   Pulmonary:     Effort: Pulmonary effort is normal. No respiratory distress.     Breath sounds: Normal breath sounds.  Abdominal:     General: Bowel sounds are normal. There is no distension.     Palpations: Abdomen is soft. There is no mass.     Tenderness: There is no abdominal tenderness. There is no guarding.  Genitourinary:    Comments: GU exam declined Musculoskeletal:        General: No tenderness. Normal range of motion.     Cervical back: Normal range of motion and neck supple.  Skin:    General: Skin is warm and dry.     Findings: No rash.  Neurological:     General: No focal deficit present.     Mental Status: He is alert and oriented to person, place, and time.     Deep Tendon Reflexes: Reflexes are normal and symmetric.  Psychiatric:        Mood and Affect: Mood normal.        Behavior: Behavior normal.  Thought Content: Thought content normal.        Judgment: Judgment normal.     Results for orders placed or performed in visit on 10/22/19  CBC with Differential/Platelet  Result Value Ref Range   WBC 8.1 3.4 - 10.8 x10E3/uL   RBC 5.34 4.14 - 5.80 x10E6/uL   Hemoglobin 16.8 13.0 - 17.7 g/dL   Hematocrit 48.4 37.5 - 51.0 %   MCV 91 79 - 97 fL   MCH 31.5 26.6 - 33.0 pg   MCHC 34.7 31 - 35 g/dL   RDW 13.7 11.6 - 15.4 %   Platelets 249 150 - 450 x10E3/uL   Neutrophils 58 Not Estab. %   Lymphs  21 Not Estab. %   Monocytes 11 Not Estab. %   Eos 9 Not Estab. %   Basos 1 Not Estab. %   Neutrophils Absolute 4.6 1 - 7 x10E3/uL   Lymphocytes Absolute 1.7 0 - 3 x10E3/uL   Monocytes Absolute 0.9 0 - 0 x10E3/uL   EOS (ABSOLUTE) 0.8 (H) 0.0 - 0.4 x10E3/uL   Basophils Absolute 0.1 0 - 0 x10E3/uL   Immature Granulocytes 0 Not Estab. %   Immature Grans (Abs) 0.0 0.0 - 0.1 x10E3/uL  Comprehensive metabolic panel  Result Value Ref Range   Glucose 102 (H) 65 - 99 mg/dL   BUN 12 6 - 24 mg/dL   Creatinine, Ser 1.01 0.76 - 1.27 mg/dL   GFR calc non Af Amer 82 >59 mL/min/1.73   GFR calc Af Amer 94 >59 mL/min/1.73   BUN/Creatinine Ratio 12 9 - 20   Sodium 140 134 - 144 mmol/L   Potassium 4.1 3.5 - 5.2 mmol/L   Chloride 101 96 - 106 mmol/L   CO2 25 20 - 29 mmol/L   Calcium 9.1 8.7 - 10.2 mg/dL   Total Protein 7.0 6.0 - 8.5 g/dL   Albumin 4.3 3.8 - 4.9 g/dL   Globulin, Total 2.7 1.5 - 4.5 g/dL   Albumin/Globulin Ratio 1.6 1.2 - 2.2   Bilirubin Total 0.5 0.0 - 1.2 mg/dL   Alkaline Phosphatase 71 48 - 121 IU/L   AST 21 0 - 40 IU/L   ALT 22 0 - 44 IU/L  Lipid Panel w/o Chol/HDL Ratio  Result Value Ref Range   Cholesterol, Total 197 100 - 199 mg/dL   Triglycerides 261 (H) 0 - 149 mg/dL   HDL 42 >39 mg/dL   VLDL Cholesterol Cal 45 (H) 5 - 40 mg/dL   LDL Chol Calc (NIH) 110 (H) 0 - 99 mg/dL  TSH  Result Value Ref Range   TSH 1.590 0.450 - 4.500 uIU/mL  UA/M w/rflx Culture, Routine   Specimen: Urine   Urine  Result Value Ref Range   Specific Gravity, UA 1.020 1.005 - 1.030   pH, UA 7.0 5.0 - 7.5   Color, UA Yellow Yellow   Appearance Ur Clear Clear   Leukocytes,UA Negative Negative   Protein,UA Negative Negative/Trace   Glucose, UA Negative Negative   Ketones, UA Negative Negative   RBC, UA Negative Negative   Bilirubin, UA Negative Negative   Urobilinogen, Ur 0.2 0.2 - 1.0 mg/dL   Nitrite, UA Negative Negative      Assessment & Plan:   Problem List Items Addressed This  Visit      Cardiovascular and Mediastinum   Benign essential hypertension - Primary    Mildly elevated today but home readings WNL, continue to monitor closely and DASH diet, exercise, stress reduction  reviewed      Relevant Medications   losartan-hydrochlorothiazide (HYZAAR) 50-12.5 MG tablet   Other Relevant Orders   CBC with Differential/Platelet (Completed)   Comprehensive metabolic panel (Completed)   TSH (Completed)   UA/M w/rflx Culture, Routine (Completed)     Nervous and Auditory   Peripheral neuropathy    Improved, followed by Neurology. Cotninue current regimen per their recommendations      Relevant Medications   pregabalin (LYRICA) 50 MG capsule    Other Visit Diagnoses    Annual physical exam       Screening examination for parasitic infection       Stool orders placed, await results   Relevant Orders   Ova and Parasite Examination   Screening for cardiovascular condition       Relevant Orders   Lipid Panel w/o Chol/HDL Ratio (Completed)       Discussed aspirin prophylaxis for myocardial infarction prevention and decision was made to continue ASA  LABORATORY TESTING:  Health maintenance labs ordered today as discussed above.   The natural history of prostate cancer and ongoing controversy regarding screening and potential treatment outcomes of prostate cancer has been discussed with the patient. The meaning of a false positive PSA and a false negative PSA has been discussed. He indicates understanding of the limitations of this screening test and wishes not to proceed with screening PSA testing.   IMMUNIZATIONS:   - Tdap: Tetanus vaccination status reviewed: last tetanus booster within 10 years. - Influenza: Up to date  SCREENING: - Colonoscopy: Up to date  Discussed with patient purpose of the colonoscopy is to detect colon cancer at curable precancerous or early stages   PATIENT COUNSELING:    Sexuality: Discussed sexually transmitted diseases,  partner selection, use of condoms, avoidance of unintended pregnancy  and contraceptive alternatives.   Advised to avoid cigarette smoking.  I discussed with the patient that most people either abstain from alcohol or drink within safe limits (<=14/week and <=4 drinks/occasion for males, <=7/weeks and <= 3 drinks/occasion for females) and that the risk for alcohol disorders and other health effects rises proportionally with the number of drinks per week and how often a drinker exceeds daily limits.  Discussed cessation/primary prevention of drug use and availability of treatment for abuse.   Diet: Encouraged to adjust caloric intake to maintain  or achieve ideal body weight, to reduce intake of dietary saturated fat and total fat, to limit sodium intake by avoiding high sodium foods and not adding table salt, and to maintain adequate dietary potassium and calcium preferably from fresh fruits, vegetables, and low-fat dairy products.    stressed the importance of regular exercise  Injury prevention: Discussed safety belts, safety helmets, smoke detector, smoking near bedding or upholstery.   Dental health: Discussed importance of regular tooth brushing, flossing, and dental visits.   Follow up plan: NEXT PREVENTATIVE PHYSICAL DUE IN 1 YEAR. Return in about 6 months (around 04/22/2020) for 6 month f/u.

## 2019-10-23 LAB — COMPREHENSIVE METABOLIC PANEL
ALT: 22 IU/L (ref 0–44)
AST: 21 IU/L (ref 0–40)
Albumin/Globulin Ratio: 1.6 (ref 1.2–2.2)
Albumin: 4.3 g/dL (ref 3.8–4.9)
Alkaline Phosphatase: 71 IU/L (ref 48–121)
BUN/Creatinine Ratio: 12 (ref 9–20)
BUN: 12 mg/dL (ref 6–24)
Bilirubin Total: 0.5 mg/dL (ref 0.0–1.2)
CO2: 25 mmol/L (ref 20–29)
Calcium: 9.1 mg/dL (ref 8.7–10.2)
Chloride: 101 mmol/L (ref 96–106)
Creatinine, Ser: 1.01 mg/dL (ref 0.76–1.27)
GFR calc Af Amer: 94 mL/min/{1.73_m2} (ref >59)
GFR calc non Af Amer: 82 mL/min/{1.73_m2} (ref >59)
Globulin, Total: 2.7 g/dL (ref 1.5–4.5)
Glucose: 102 mg/dL — ABNORMAL HIGH (ref 65–99)
Potassium: 4.1 mmol/L (ref 3.5–5.2)
Sodium: 140 mmol/L (ref 134–144)
Total Protein: 7 g/dL (ref 6.0–8.5)

## 2019-10-23 LAB — CBC WITH DIFFERENTIAL/PLATELET
Basophils Absolute: 0.1 10*3/uL (ref 0.0–0.2)
Basos: 1 %
EOS (ABSOLUTE): 0.8 10*3/uL — ABNORMAL HIGH (ref 0.0–0.4)
Eos: 9 %
Hematocrit: 48.4 % (ref 37.5–51.0)
Hemoglobin: 16.8 g/dL (ref 13.0–17.7)
Immature Grans (Abs): 0 10*3/uL (ref 0.0–0.1)
Immature Granulocytes: 0 %
Lymphocytes Absolute: 1.7 10*3/uL (ref 0.7–3.1)
Lymphs: 21 %
MCH: 31.5 pg (ref 26.6–33.0)
MCHC: 34.7 g/dL (ref 31.5–35.7)
MCV: 91 fL (ref 79–97)
Monocytes Absolute: 0.9 10*3/uL (ref 0.1–0.9)
Monocytes: 11 %
Neutrophils Absolute: 4.6 10*3/uL (ref 1.4–7.0)
Neutrophils: 58 %
Platelets: 249 10*3/uL (ref 150–450)
RBC: 5.34 x10E6/uL (ref 4.14–5.80)
RDW: 13.7 % (ref 11.6–15.4)
WBC: 8.1 10*3/uL (ref 3.4–10.8)

## 2019-10-23 LAB — LIPID PANEL W/O CHOL/HDL RATIO
Cholesterol, Total: 197 mg/dL (ref 100–199)
HDL: 42 mg/dL (ref >39)
LDL Chol Calc (NIH): 110 mg/dL — ABNORMAL HIGH (ref 0–99)
Triglycerides: 261 mg/dL — ABNORMAL HIGH (ref 0–149)
VLDL Cholesterol Cal: 45 mg/dL — ABNORMAL HIGH (ref 5–40)

## 2019-10-23 LAB — TSH: TSH: 1.59 u[IU]/mL (ref 0.450–4.500)

## 2019-10-23 NOTE — Assessment & Plan Note (Signed)
Mildly elevated today but home readings WNL, continue to monitor closely and DASH diet, exercise, stress reduction reviewed

## 2019-10-23 NOTE — Assessment & Plan Note (Signed)
Improved, followed by Neurology. Cotninue current regimen per their recommendations

## 2019-11-30 ENCOUNTER — Other Ambulatory Visit: Payer: Self-pay | Admitting: Family Medicine

## 2019-12-12 ENCOUNTER — Encounter: Payer: Self-pay | Admitting: Family Medicine

## 2019-12-14 ENCOUNTER — Other Ambulatory Visit: Payer: Self-pay | Admitting: Family Medicine

## 2019-12-14 NOTE — Telephone Encounter (Signed)
Routing to provider to advise.  

## 2019-12-14 NOTE — Telephone Encounter (Signed)
Phone call to pt. To inquire about the medication he has requested.  Stated Biltricide was something he has taken in the past due to tapeworm.  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  Stated he took Biltricide previously, and it eliminated a tapeworm that he had.  Stated he has been much more tired, lately.  Questioned pt. About the stool sample that was ordered for Ova and Parasites in June.  Stated he has been out of town, and has not been able to bring it in.  Reported he can collect a stool sample and bring in to lab on Monday.  Pt. Is requesting a call back re: if Biltricide can be ordered or not.  Will send note to provider.

## 2019-12-14 NOTE — Telephone Encounter (Signed)
Medication Refill -Biltricide 600mg  (Patient requesting a callback once medication has been sent to pharmacy)  Has the patient contacted their pharmacy?yes (Agent: If no, request that the patient contact the pharmacy for the refill.) (Agent: If yes, when and what did the pharmacy advise?)contact pcp  Preferred Pharmacy (with phone number or street name): [id number], [place] - [address] AT [facility] [address] Phone:  [phone]  Fax:  [phone]       Agent: Please be advised that RX refills may take up to 3 business days. We ask that you follow-up with your pharmacy.

## 2019-12-14 NOTE — Telephone Encounter (Signed)
Patient notified of Rachel's message. Will bring stool sample on Monday.

## 2019-12-14 NOTE — Telephone Encounter (Signed)
Would he need an office visit or can he come drop off specimen from order placed on 10/22/2019 for an Ova and Parasite Examination/

## 2019-12-14 NOTE — Telephone Encounter (Signed)
Can do stool studies from prior order

## 2019-12-18 NOTE — Telephone Encounter (Signed)
Pt called saying he needs this refill on his [place].   Dispensing optician

## 2019-12-18 NOTE — Addendum Note (Signed)
Addended by: Pablo Ledger on: 12/18/2019 09:46 AM   Modules accepted: Orders

## 2019-12-18 NOTE — Telephone Encounter (Signed)
Last seen 10/22/19. Last RX was sent to local pharmacy. Can we resend to mail order please?

## 2019-12-19 MED ORDER — LOSARTAN POTASSIUM-HCTZ 50-12.5 MG PO TABS: 1.0000 | ORAL_TABLET | Freq: Every day | ORAL | 1 refills | Status: DC

## 2020-01-04 ENCOUNTER — Other Ambulatory Visit: Payer: 59

## 2020-01-11 LAB — OVA AND PARASITE EXAMINATION

## 2020-04-21 ENCOUNTER — Ambulatory Visit: Payer: 59 | Admitting: Family Medicine

## 2020-04-21 ENCOUNTER — Telehealth: Payer: Self-pay

## 2020-04-21 ENCOUNTER — Ambulatory Visit: Payer: 59 | Admitting: Nurse Practitioner

## 2020-04-21 NOTE — Telephone Encounter (Signed)
Patient will contact his specialist.

## 2020-04-21 NOTE — Telephone Encounter (Signed)
Pt had to r/s appt due to working out of town until 1/10, he is needing to have a refill of his pregabalin and his duloxetine sent to [facility] on [address]. Please advise if pt can get enough to last until appt.

## 2020-04-21 NOTE — Telephone Encounter (Signed)
I don't see where we have ever written those medicines. Who was he getting them from previously?

## 2020-04-21 NOTE — Telephone Encounter (Signed)
Called patient to discuss, he answered but was unable to hear me will call back at 3pm.

## 2020-04-22 ENCOUNTER — Ambulatory Visit: Payer: 59 | Admitting: Family Medicine

## 2020-05-09 ENCOUNTER — Ambulatory Visit
Admission: RE | Admit: 2020-05-09 | Discharge: 2020-05-09 | Disposition: A | Discharge status: Home / Self Care | Payer: 59 | Source: Ambulatory Visit | Attending: Family Medicine | Admitting: Family Medicine

## 2020-05-09 ENCOUNTER — Other Ambulatory Visit: Payer: Self-pay

## 2020-05-09 VITALS — BP 131/83 | HR 83 | Temp 100.4°F | Resp 19 | Ht 69.0 in | Wt 222.7 lb

## 2020-05-09 DIAGNOSIS — R0982 Postnasal drip: Secondary | ICD-10-CM

## 2020-05-09 DIAGNOSIS — B349 Viral infection, unspecified: Secondary | ICD-10-CM

## 2020-05-09 DIAGNOSIS — R5383 Other fatigue: Secondary | ICD-10-CM

## 2020-05-09 DIAGNOSIS — R059 Cough, unspecified: Secondary | ICD-10-CM

## 2020-05-09 DIAGNOSIS — R509 Fever, unspecified: Secondary | ICD-10-CM

## 2020-05-09 NOTE — ED Triage Notes (Signed)
Fatigue, nasal congestion and cough since last night. Had neg home covid test.

## 2020-05-09 NOTE — ED Provider Notes (Signed)
[place]   [id number] 05/09/20 Arrival Time: 1802   CC: COVID symptoms  SUBJECTIVE: History from: patient.  Jesse Diaz is a 60 y.o. male who presents with fatigue, nasal congestion, cough since last night. Reports negative home Covid test today. Denies sick exposure to COVID, flu or strep. Denies recent travel. Has previous history of Covid. Has not completed Covid vaccines. Has not taken OTC medications for this. There are no aggravating or alleviating factors. Denies previous symptoms in the past. Denies fever, chills, sinus pain, rhinorrhea, sore throat, SOB, wheezing, chest pain, nausea, changes in bowel or bladder habits.    ROS: As per HPI.  All other pertinent ROS negative.     Past Medical History:  Diagnosis Date  . Anxiety   . Asthma   . Depression   . GERD (gastroesophageal reflux disease)    in past  . Hypertension   . Sinus drainage   . Wears dentures    partial lower   Past Surgical History:  Procedure Laterality Date  . CARDIOVASCULAR STRESS TEST     approx 8 yrs ago - anxiety/reflux  . COLONOSCOPY    . COLONOSCOPY WITH PROPOFOL N/A 04/02/2016   Procedure: COLONOSCOPY WITH PROPOFOL;  Surgeon: Manya Silvas, MD;  Location[facility] ENDOSCOPY;  Service: Endoscopy;  Laterality: N/A;  . ESOPHAGEAL DILATION     striction  . ESOPHAGOGASTRODUODENOSCOPY (EGD) WITH PROPOFOL N/A 04/02/2016   Procedure: ESOPHAGOGASTRODUODENOSCOPY (EGD) WITH PROPOFOL;  Surgeon: Manya Silvas, MD;  Location: [facility] ENDOSCOPY;  Service: Endoscopy;  Laterality: N/A;  . EXTRACORPOREAL SHOCK WAVE LITHOTRIPSY Right 12/25/2015   Procedure: EXTRACORPOREAL SHOCK WAVE LITHOTRIPSY (ESWL);  Surgeon: Royston Cowper, MD;  Location: [facility] ORS;  Service: Urology;  Laterality: Right;  . KNEE ARTHROSCOPY Bilateral   . KNEE ARTHROSCOPY    . SEPTOPLASTY N/A 04/04/2015   Procedure: SEPTOPLASTY;  Surgeon: Beverly Gust, MD;  Location: [place];  Service: ENT;  Laterality: N/A;  .  TONSILLECTOMY    . TURBINATE REDUCTION N/A 04/04/2015   Procedure: TURBINATE REDUCTION SMR;  Surgeon: Beverly Gust, MD;  Location: [place];  Service: ENT;  Laterality: N/A;  . UPPER GI ENDOSCOPY     No Known Allergies No current facility-administered medications on file prior to encounter.   Current Outpatient Medications on File Prior to Encounter  Medication Sig Dispense Refill  . albuterol (VENTOLIN HFA) 108 (90 Base) MCG/ACT inhaler TAKE 2 PUFFS BY MOUTH EVERY 6 HOURS AS NEEDED FOR WHEEZE OR SHORTNESS OF BREATH 18 g 0  . ammonium lactate (AMLACTIN) 12 % cream Apply topically as needed for dry skin. 385 g 1  . Biotin 10000 MCG TABS Take 10,000 mg by mouth daily.    Marland Kitchen CALCIUM CITRATE PO Take 750 mg by mouth daily.    . clotrimazole (LOTRIMIN) 1 % cream Apply 1 application topically 2 (two) times daily. To feet over scaly places 60 g 2  . co-enzyme Q-10 30 MG capsule Take 30 mg by mouth daily.    . DULoxetine (CYMBALTA) 60 MG capsule Take 1 capsule by mouth daily.    Marland Kitchen losartan-hydrochlorothiazide (HYZAAR) 50-12.5 MG tablet Take 1 tablet by mouth daily. 90 tablet 1  . Potassium 99 MG TABS Take 99 mg by mouth daily. (Patient not taking: Reported on 10/22/2019)    . pregabalin (LYRICA) 50 MG capsule Take by mouth 3 (three) times daily. Per neurology    . Terbinafine HCl 1 % SOLN Apply 1 application topically 2 (two)  times daily. To toenails 125 mL 6  . TURMERIC PO Take by mouth.     Social History   Socioeconomic History  . Marital status: Married    Spouse name: Not on file  . Number of children: Not on file  . Years of education: Not on file  . Highest education level: Not on file  Occupational History  . Not on file  Tobacco Use  . Smoking status: Never Smoker  . Smokeless tobacco: Former Neurosurgeon  . Tobacco comment: quit chew - approx 2008  Vaping Use  . Vaping Use: Never used  Substance and Sexual Activity  . Alcohol use: No  . Drug use: No  . Sexual activity:  Not on file  Other Topics Concern  . Not on file  Social History Narrative  . Not on file   Social Determinants of Health   Financial Resource Strain: Not on file  Food Insecurity: Not on file  Transportation Needs: Not on file  Physical Activity: Not on file  Stress: Not on file  Social Connections: Not on file  Intimate Partner Violence: Not on file   Family History  Problem Relation Age of Onset  . Stroke Mother   . Colon polyps Mother   . Hypertension Mother   . Leukemia Father   . Cancer Father        prostate  . Heart attack Father   . Stroke Father   . Colon polyps Sister   . Cancer Maternal Aunt   . Diabetes Maternal Aunt   . Hypertension Maternal Aunt   . Cancer Maternal Uncle        prostate  . Diabetes Maternal Uncle   . Hypertension Maternal Uncle     OBJECTIVE:  Vitals:   05/09/20 1834 05/09/20 1836  BP:  131/83  Pulse:  83  Resp:  19  Temp:  (!) 100.4 F (38 C)  TempSrc:  Oral  SpO2:  96%  Weight: 222 lb 10.6 oz (101 kg)   Height: 5\' 9"  (1.753 m)      General appearance: alert; appears fatigued, but nontoxic; speaking in full sentences and tolerating own secretions HEENT: NCAT; Ears: EACs clear, TMs pearly gray; Eyes: PERRL.  EOM grossly intact. Sinuses: nontender; Nose: nares patent with clear rhinorrhea, Throat: oropharynx erythematous, cobblestoning present, tonsils non erythematous or enlarged, uvula midline  Neck: supple without LAD Lungs: unlabored respirations, symmetrical air entry; cough: mild; no respiratory distress; CTAB Heart: regular rate and rhythm.  Radial pulses 2+ symmetrical bilaterally Skin: warm and dry Psychological: alert and cooperative; normal mood and affect  LABS:  No results found for this or any previous visit (from the past 24 hour(s)).   ASSESSMENT & PLAN:  1. Viral illness   2. Fever, unspecified   3. Cough   4. Other fatigue   5. Post-nasal drip    Continue supportive care at home COVID and flu  testing ordered.  It will take between 2-3 days for test results. Someone will contact you regarding abnormal results.    Patient should remain in quarantine until they have received Covid results.  If negative you may resume normal activities (go back to work/school) while practicing hand hygiene, social distance, and mask wearing.  If positive, patient should remain in quarantine for at least 5 days from symptom onset AND greater than 72 hours after symptoms resolution (absence of fever without the use of fever-reducing medication and improvement in respiratory symptoms), whichever is longer Get plenty of  rest and push fluids Use OTC zyrtec for nasal congestion, runny nose, and/or sore throat Use OTC flonase for nasal congestion and runny nose Use medications daily for symptom relief Use OTC medications like ibuprofen or tylenol as needed fever or pain Call or go to the ED if you have any new or worsening symptoms such as fever, worsening cough, shortness of breath, chest tightness, chest pain, turning blue, changes in mental status.  Reviewed expectations re: course of current medical issues. Questions answered. Outlined signs and symptoms indicating need for more acute intervention. Patient verbalized understanding. After Visit Summary given.         Moshe Cipro, NP 05/11/20 1940

## 2020-05-09 NOTE — Discharge Instructions (Addendum)
You may use plain mucinex for congestion  You may use dimetapp or robitussin for cough  If the cough has not improved with the above medications, let me know on Sunday and I will send you something for the cough  Your COVID and Flu tests are pending.  You should self quarantine until the test results are back.    Take Tylenol or ibuprofen as needed for fever or discomfort.  Rest and keep yourself hydrated.    Follow-up with your primary care provider if your symptoms are not improving.

## 2020-05-11 ENCOUNTER — Telehealth: Payer: Self-pay | Admitting: Family Medicine

## 2020-05-11 DIAGNOSIS — R059 Cough, unspecified: Secondary | ICD-10-CM

## 2020-05-11 MED ORDER — PROMETHAZINE-DM 6.25-15 MG/5ML PO SYRP: 5.0000 mL | ORAL_SOLUTION | Freq: Four times a day (QID) | ORAL | 0 refills | Status: DC | PRN

## 2020-05-11 NOTE — Telephone Encounter (Signed)
Cough has not improved, sent in promethazine syrup.

## 2020-05-12 ENCOUNTER — Ambulatory Visit: Payer: 59 | Admitting: Family Medicine

## 2020-05-15 LAB — COVID-19, FLU A+B NAA
Influenza A, NAA: NOT DETECTED
Influenza B, NAA: NOT DETECTED
SARS-CoV-2, NAA: DETECTED — AB

## 2020-06-09 ENCOUNTER — Other Ambulatory Visit: Payer: Self-pay

## 2020-06-09 ENCOUNTER — Encounter: Payer: Self-pay | Admitting: Family Medicine

## 2020-06-09 ENCOUNTER — Ambulatory Visit
Admission: RE | Admit: 2020-06-09 | Discharge: 2020-06-09 | Disposition: A | Discharge status: Home / Self Care | Payer: 59 | Source: Home / Self Care | Attending: Family Medicine | Admitting: Family Medicine

## 2020-06-09 ENCOUNTER — Ambulatory Visit
Admission: RE | Admit: 2020-06-09 | Discharge: 2020-06-09 | Disposition: A | Discharge status: Home / Self Care | Payer: 59 | Source: Ambulatory Visit | Attending: Family Medicine | Admitting: Family Medicine

## 2020-06-09 ENCOUNTER — Ambulatory Visit: Payer: 59 | Admitting: Family Medicine

## 2020-06-09 VITALS — BP 134/78 | HR 58 | Temp 98.5°F | Wt 220.0 lb

## 2020-06-09 DIAGNOSIS — R0602 Shortness of breath: Secondary | ICD-10-CM | POA: Insufficient documentation

## 2020-06-09 DIAGNOSIS — I10 Essential (primary) hypertension: Secondary | ICD-10-CM | POA: Diagnosis not present

## 2020-06-09 DIAGNOSIS — F418 Other specified anxiety disorders: Secondary | ICD-10-CM | POA: Diagnosis not present

## 2020-06-09 DIAGNOSIS — G629 Polyneuropathy, unspecified: Secondary | ICD-10-CM

## 2020-06-09 MED ORDER — DULOXETINE HCL 20 MG PO CPEP: ORAL_CAPSULE | ORAL | 1 refills | Status: DC

## 2020-06-09 MED ORDER — LOSARTAN POTASSIUM-HCTZ 50-12.5 MG PO TABS: 1.0000 | ORAL_TABLET | Freq: Every day | ORAL | 1 refills | Status: DC

## 2020-06-09 NOTE — Assessment & Plan Note (Signed)
Under good control on current regimen. Continue current regimen. Continue to monitor. Call with any concerns. Refills given. Labs drawn today.   

## 2020-06-09 NOTE — Progress Notes (Signed)
BP 134/78   Pulse (!) 58   Temp 98.5 F (36.9 C)   Wt 220 lb (99.8 kg) Comment: Patient reported  SpO2 97%   BMI 32.49 kg/m    Subjective:    Patient ID: Jesse Diaz, male    DOB: 1960-09-24, 60 y.o.   MRN: [id number]  HPI: WALT GEATHERS is a 60 y.o. male  Chief Complaint  Patient presents with  . Hypertension  . Peripheral Neuropathy   HYPERTENSION Hypertension status: controlled  Satisfied with current treatment? yes Duration of hypertension: chronic BP monitoring frequency:  not checking BP medication side effects:  no Medication compliance: excellent compliance Previous BP meds: losartan-HCTZ Aspirin: yes Recurrent headaches: no Visual changes: no Palpitations: no Dyspnea: no Chest pain: no Lower extremity edema: no Dizzy/lightheaded: no  NEUROPATHY- has been off his cymbalta for about 3-4 months Neuropathy status: uncontrolled  Satisfied with current treatment?: no Medication side effects: no Medication compliance:  fair compliance Location: feet Pain: yes Severity: moderate  Quality:  Numb and tingling Frequency: constant Bilateral: yes Symmetric: yes Numbness: yes Decreased sensation: yes Weakness: no Context: worse Treatments attempted: lyrica, duloxetine, requip  Relevant past medical, surgical, family and social history reviewed and updated as indicated. Interim medical history since our last visit reviewed. Allergies and medications reviewed and updated.  Review of Systems  Constitutional: Negative.   Respiratory: Positive for shortness of breath. Negative for apnea, cough, choking, chest tightness, wheezing and stridor.   Cardiovascular: Negative.   Gastrointestinal: Negative.   Musculoskeletal: Negative.   Psychiatric/Behavioral: Negative.     Per HPI unless specifically indicated above     Objective:    BP 134/78   Pulse (!) 58   Temp 98.5 F (36.9 C)   Wt 220 lb (99.8 kg) Comment: Patient reported  SpO2 97%   BMI 32.49  kg/m   Wt Readings from Last 3 Encounters:  06/09/20 220 lb (99.8 kg)  05/09/20 222 lb 10.6 oz (101 kg)  10/22/19 224 lb (101.6 kg)    Physical Exam Vitals and nursing note reviewed.  Constitutional:      General: He is not in acute distress.    Appearance: Normal appearance. He is not ill-appearing, toxic-appearing or diaphoretic.  HENT:     Head: Normocephalic and atraumatic.     Right Ear: External ear normal.     Left Ear: External ear normal.     Nose: Nose normal.     Mouth/Throat:     Mouth: Mucous membranes are moist.     Pharynx: Oropharynx is clear.  Eyes:     General: No scleral icterus.       Right eye: No discharge.        Left eye: No discharge.     Extraocular Movements: Extraocular movements intact.     Conjunctiva/sclera: Conjunctivae normal.     Pupils: Pupils are equal, round, and reactive to light.  Cardiovascular:     Rate and Rhythm: Normal rate and regular rhythm.     Pulses: Normal pulses.     Heart sounds: Normal heart sounds. No murmur heard. No friction rub. No gallop.   Pulmonary:     Effort: Pulmonary effort is normal. No respiratory distress.     Breath sounds: Normal breath sounds. No stridor. No wheezing, rhonchi or rales.  Chest:     Chest wall: No tenderness.  Musculoskeletal:        General: Normal range of motion.     Cervical back:  Normal range of motion and neck supple.  Skin:    General: Skin is warm and dry.     Capillary Refill: Capillary refill takes less than 2 seconds.     Coloration: Skin is not jaundiced or pale.     Findings: No bruising, erythema, lesion or rash.  Neurological:     General: No focal deficit present.     Mental Status: He is alert and oriented to person, place, and time. Mental status is at baseline.  Psychiatric:        Mood and Affect: Mood normal.        Behavior: Behavior normal.        Thought Content: Thought content normal.        Judgment: Judgment normal.     Results for orders placed or  performed during the hospital encounter of 05/09/20  Covid-19, Flu A+B ([facility])   Specimen: Nasopharyngeal   Naso  Result Value Ref Range   SARS-CoV-2, NAA Detected (A) Not Detected   Influenza A, NAA Not Detected Not Detected   Influenza B, NAA Not Detected Not Detected      Assessment & Plan:   Problem List Items Addressed This Visit      Cardiovascular and Mediastinum   Benign essential hypertension - Primary    Under good control on current regimen. Continue current regimen. Continue to monitor. Call with any concerns. Refills given. Labs drawn today.       Relevant Medications   losartan-hydrochlorothiazide (HYZAAR) 50-12.5 MG tablet   furosemide (LASIX) 20 MG tablet   Other Relevant Orders   Basic metabolic panel     Nervous and Auditory   Peripheral neuropathy    Has been off his cymbalta. Will get him restarted on it and recheck 2 months. Call with any concerns.       Relevant Medications   DULoxetine (CYMBALTA) 20 MG capsule   rOPINIRole (REQUIP) 0.5 MG tablet     Other   Depression with anxiety    Under good control on current regimen. Continue current regimen. Continue to monitor. Call with any concerns. Refills given.        Relevant Medications   DULoxetine (CYMBALTA) 20 MG capsule    Other Visit Diagnoses    SOB (shortness of breath)       Chronic since COVID. Lungs clear. Will check CXR. Await results.    Relevant Orders   DG Chest 2 View       Follow up plan: Return in about 2 months (around 08/07/2020).

## 2020-06-09 NOTE — Assessment & Plan Note (Signed)
Has been off his cymbalta. Will get him restarted on it and recheck 2 months. Call with any concerns.

## 2020-06-09 NOTE — Assessment & Plan Note (Signed)
Under good control on current regimen. Continue current regimen. Continue to monitor. Call with any concerns. Refills given.   

## 2020-06-10 LAB — BASIC METABOLIC PANEL
BUN/Creatinine Ratio: 15 (ref 9–20)
BUN: 13 mg/dL (ref 6–24)
CO2: 27 mmol/L (ref 20–29)
Calcium: 9 mg/dL (ref 8.7–10.2)
Chloride: 101 mmol/L (ref 96–106)
Creatinine, Ser: 0.85 mg/dL (ref 0.76–1.27)
GFR calc Af Amer: 110 mL/min/{1.73_m2} (ref >59)
GFR calc non Af Amer: 95 mL/min/{1.73_m2} (ref >59)
Glucose: 106 mg/dL — ABNORMAL HIGH (ref 65–99)
Potassium: 4 mmol/L (ref 3.5–5.2)
Sodium: 141 mmol/L (ref 134–144)

## 2020-06-14 ENCOUNTER — Other Ambulatory Visit: Payer: Self-pay | Admitting: Family Medicine

## 2020-06-14 DIAGNOSIS — R9389 Abnormal findings on diagnostic imaging of other specified body structures: Secondary | ICD-10-CM

## 2020-06-16 ENCOUNTER — Ambulatory Visit
Admission: RE | Admit: 2020-06-16 | Discharge: 2020-06-16 | Disposition: A | Discharge status: Home / Self Care | Payer: 59 | Attending: Family Medicine | Admitting: Family Medicine

## 2020-06-16 ENCOUNTER — Ambulatory Visit
Admission: RE | Admit: 2020-06-16 | Discharge: 2020-06-16 | Disposition: A | Discharge status: Home / Self Care | Payer: 59 | Source: Ambulatory Visit | Attending: Family Medicine | Admitting: Family Medicine

## 2020-06-16 ENCOUNTER — Other Ambulatory Visit: Payer: Self-pay | Admitting: Family Medicine

## 2020-06-16 ENCOUNTER — Other Ambulatory Visit: Payer: Self-pay

## 2020-06-16 DIAGNOSIS — R9389 Abnormal findings on diagnostic imaging of other specified body structures: Secondary | ICD-10-CM

## 2020-06-22 ENCOUNTER — Other Ambulatory Visit: Payer: Self-pay | Admitting: Family Medicine

## 2020-07-07 DIAGNOSIS — G4733 Obstructive sleep apnea (adult) (pediatric): Secondary | ICD-10-CM | POA: Insufficient documentation

## 2020-08-03 NOTE — Progress Notes (Signed)
BP 136/78   Pulse 71   Temp 98.8 F (37.1 C)   Wt 222 lb (100.7 kg)   SpO2 99%   BMI 32.78 kg/m    Subjective:    Patient ID: Jesse Diaz, male    DOB: 22-Feb-1961, 60 y.o.   MRN: [id number]  HPI: Jesse Diaz is a 60 y.o. male  Chief Complaint  Patient presents with  . Peripheral Neuropathy   NEUROPATHY Patient has stopped his Cymbalta prior to last visit. Has since restarted Cymbalta.  Reports his neuropathy is still the same.  He is taking Cymbalta 40mg  daily and Requip at night (2 tabs) with little relief.  Patient has not seen neurology since September 2021.  He states he has not seen any relief with gabapentin or Lyrica in the past. Feet feel like ice at night and he is very restless where is legs jerk and wake him up.  HYPERTENSION Patient does not check it at home.  Patient states that when his blood pressure is checked with an automatic cuff it is always elevated  Then when it is checked with a manual cuff it is down.  Denies HA, CP, SOB, visual disturbance, leg swelling and dizziness   Relevant past medical, surgical, family and social history reviewed and updated as indicated. Interim medical history since our last visit reviewed. Allergies and medications reviewed and updated.  Review of Systems  Eyes: Negative for visual disturbance.  Respiratory: Negative for shortness of breath.   Cardiovascular: Negative for chest pain and leg swelling.  Neurological: Negative for light-headedness and headaches.       Feet feel like ice and jerk at night.    Per HPI unless specifically indicated above     Objective:    BP 136/78   Pulse 71   Temp 98.8 F (37.1 C)   Wt 222 lb (100.7 kg)   SpO2 99%   BMI 32.78 kg/m   Wt Readings from Last 3 Encounters:  08/04/20 222 lb (100.7 kg)  06/09/20 220 lb (99.8 kg)  05/09/20 222 lb 10.6 oz (101 kg)    Physical Exam  Results for orders placed or performed in visit on 06/09/20  Basic metabolic panel  Result Value Ref  Range   Glucose 106 (H) 65 - 99 mg/dL   BUN 13 6 - 24 mg/dL   Creatinine, Ser 08/07/20 0.76 - 1.27 mg/dL   GFR calc non Af Amer 95 >59 mL/min/1.73   GFR calc Af Amer 110 >59 mL/min/1.73   BUN/Creatinine Ratio 15 9 - 20   Sodium 141 134 - 144 mmol/L   Potassium 4.0 3.5 - 5.2 mmol/L   Chloride 101 96 - 106 mmol/L   CO2 27 20 - 29 mmol/L   Calcium 9.0 8.7 - 10.2 mg/dL      Assessment & Plan:   Problem List Items Addressed This Visit      Nervous and Auditory   Peripheral neuropathy - Primary    Chronic. Unontrolled.  Increase Cymbalta to 60mg  daily.  Increase Requip to 3 tabs daily.  Follow up with Neurology.  Return in 2 months for reevaluation.  Refills sent today.       Relevant Medications   DULoxetine (CYMBALTA) 20 MG capsule       Follow up plan: Return in about 2 months (around 10/04/2020) for Peripherial Neuropathy.  A total of 30 minutes were spent on this encounter today.  When total time is documented, this includes both the  face-to-face and non-face-to-face time personally spent before, during and after the visit on the date of the encounter.

## 2020-08-03 NOTE — Assessment & Plan Note (Addendum)
Chronic. Unontrolled.  Increase Cymbalta to 60mg  daily.  Increase Requip to 3 tabs daily.  Follow up with Neurology.  Return in 2 months for reevaluation.  Refills sent today.

## 2020-08-04 ENCOUNTER — Encounter: Payer: Self-pay | Admitting: Nurse Practitioner

## 2020-08-04 ENCOUNTER — Ambulatory Visit: Payer: 59 | Admitting: Nurse Practitioner

## 2020-08-04 ENCOUNTER — Other Ambulatory Visit: Payer: Self-pay

## 2020-08-04 VITALS — BP 136/78 | HR 71 | Temp 98.8°F | Wt 222.0 lb

## 2020-08-04 DIAGNOSIS — G629 Polyneuropathy, unspecified: Secondary | ICD-10-CM

## 2020-08-04 MED ORDER — DULOXETINE HCL 20 MG PO CPEP: ORAL_CAPSULE | ORAL | 1 refills | Status: DC

## 2020-08-06 ENCOUNTER — Other Ambulatory Visit: Payer: Self-pay | Admitting: Family Medicine

## 2020-10-05 NOTE — Progress Notes (Deleted)
   There were no vitals taken for this visit.   Subjective:    Patient ID: Jesse Diaz, male    DOB: 02/21/1961, 60 y.o.   MRN: [id number]  HPI: Jesse Diaz is a 60 y.o. male  No chief complaint on file.  NEUROPATHY Patient has stopped his Cymbalta prior to last visit. Has since restarted Cymbalta.  Reports his neuropathy is still the same.  He is taking Cymbalta 60mg  daily and Requip at night (3 tabs) with little relief.  Patient has not seen neurology since September 2021.  He states he has not seen any relief with gabapentin or Lyrica in the past. Feet feel like ice at night and he is very restless where is legs jerk and wake him up.  HYPERTENSION / HYPERLIPIDEMIA Satisfied with current treatment? {Blank single:19197::"yes","no"} Duration of hypertension: {Blank single:19197::"chronic","months","years"} BP monitoring frequency: {Blank single:19197::"not checking","rarely","daily","weekly","monthly","a few times a day","a few times a week","a few times a month"} BP range:  BP medication side effects: {Blank single:19197::"yes","no"} Past BP meds: {Blank multiple:19196::"none","amlodipine","amlodipine/benazepril","atenolol","benazepril","benazepril/HCTZ","bisoprolol (bystolic)","carvedilol","chlorthalidone","clonidine","diltiazem","exforge HCT","HCTZ","irbesartan (avapro)","labetalol","lisinopril","lisinopril-HCTZ","losartan (cozaar)","methyldopa","nifedipine","olmesartan (benicar)","olmesartan-HCTZ","quinapril","ramipril","spironalactone","tekturna","valsartan","valsartan-HCTZ","verapamil"} Duration of hyperlipidemia: {Blank single:19197::"chronic","months","years"} Cholesterol medication side effects: {Blank single:19197::"yes","no"} Cholesterol supplements: {Blank multiple:19196::"none","fish oil","niacin","red yeast rice"} Past cholesterol medications: {Blank multiple:19196::"none","atorvastain (lipitor)","lovastatin (mevacor)","pravastatin (pravachol)","rosuvastatin  (crestor)","simvastatin (zocor)","vytorin","fenofibrate (tricor)","gemfibrozil","ezetimide (zetia)","niaspan","lovaza"} Medication compliance: {Blank single:19197::"excellent compliance","good compliance","fair compliance","poor compliance"} Aspirin: {Blank single:19197::"yes","no"} Recent stressors: {Blank single:19197::"yes","no"} Recurrent headaches: {Blank single:19197::"yes","no"} Visual changes: {Blank single:19197::"yes","no"} Palpitations: {Blank single:19197::"yes","no"} Dyspnea: {Blank single:19197::"yes","no"} Chest pain: {Blank single:19197::"yes","no"} Lower extremity edema: {Blank single:19197::"yes","no"} Dizzy/lightheaded: {Blank single:19197::"yes","no"}    Relevant past medical, surgical, family and social history reviewed and updated as indicated. Interim medical history since our last visit reviewed. Allergies and medications reviewed and updated.  Review of Systems  Eyes: Negative for visual disturbance.  Respiratory: Negative for shortness of breath.   Cardiovascular: Negative for chest pain and leg swelling.  Neurological: Negative for light-headedness and headaches.       Feet feel like ice and jerk at night.    Per HPI unless specifically indicated above     Objective:    There were no vitals taken for this visit.  Wt Readings from Last 3 Encounters:  08/04/20 222 lb (100.7 kg)  06/09/20 220 lb (99.8 kg)  05/09/20 222 lb 10.6 oz (101 kg)    Physical Exam  Results for orders placed or performed in visit on 06/09/20  Basic metabolic panel  Result Value Ref Range   Glucose 106 (H) 65 - 99 mg/dL   BUN 13 6 - 24 mg/dL   Creatinine, Ser 08/07/20 0.76 - 1.27 mg/dL   GFR calc non Af Amer 95 >59 mL/min/1.73   GFR calc Af Amer 110 >59 mL/min/1.73   BUN/Creatinine Ratio 15 9 - 20   Sodium 141 134 - 144 mmol/L   Potassium 4.0 3.5 - 5.2 mmol/L   Chloride 101 96 - 106 mmol/L   CO2 27 20 - 29 mmol/L   Calcium 9.0 8.7 - 10.2 mg/dL      Assessment & Plan:    Problem List Items Addressed This Visit   None      Follow up plan: No follow-ups on file.  A total of 30 minutes were spent on this encounter today.  When total time is documented, this includes both the face-to-face and non-face-to-face time personally spent before, during and after the visit on the date of the encounter.

## 2020-10-06 ENCOUNTER — Ambulatory Visit: Payer: 59 | Admitting: Nurse Practitioner

## 2020-10-06 ENCOUNTER — Other Ambulatory Visit: Payer: Self-pay

## 2020-10-06 ENCOUNTER — Ambulatory Visit: Payer: 59 | Admitting: Family Medicine

## 2020-10-06 ENCOUNTER — Encounter: Payer: Self-pay | Admitting: Family Medicine

## 2020-10-06 VITALS — BP 151/80 | HR 66 | Temp 97.9°F | Ht 68.5 in | Wt 226.2 lb

## 2020-10-06 DIAGNOSIS — I10 Essential (primary) hypertension: Secondary | ICD-10-CM

## 2020-10-06 DIAGNOSIS — E7800 Pure hypercholesterolemia, unspecified: Secondary | ICD-10-CM

## 2020-10-06 DIAGNOSIS — G629 Polyneuropathy, unspecified: Secondary | ICD-10-CM | POA: Diagnosis not present

## 2020-10-06 DIAGNOSIS — F418 Other specified anxiety disorders: Secondary | ICD-10-CM

## 2020-10-06 NOTE — Assessment & Plan Note (Signed)
Doing better. Stable on medicine. Only taking 2 requip a day rather than 3. Tolerating cymbalta well. Following with chiropractry. Continue current regimen. Up to date on refills. Follow up in August for his physical. Call with any concerns.

## 2020-10-06 NOTE — Progress Notes (Signed)
BP (!) 151/80   Pulse 66   Temp 97.9 F (36.6 C)   Ht 5' 8.5" (1.74 m)   Wt 226 lb 3.2 oz (102.6 kg)   SpO2 96%   BMI 33.89 kg/m    Subjective:    Patient ID: Jesse Diaz, male    DOB: 1960/12/11, 60 y.o.   MRN: [id number]  HPI: Jesse Diaz is a 60 y.o. male  Chief Complaint  Patient presents with  . Peripheral Neuropathy   NEUROPATHY- has been doing some chiropractic treatment with TENs units and vibrations and that seems to be really helping. Had been following with neurology Neuropathy status: better  Satisfied with current treatment?: yes Medication side effects: no Medication compliance:  excellent compliance Location: bilateral feet Pain: yes Severity: moderate  Quality:  Numbness and tingling Frequency: constant Bilateral: yes Symmetric: yes Numbness: yes Decreased sensation: yes Weakness: no Context: better  Relevant past medical, surgical, family and social history reviewed and updated as indicated. Interim medical history since our last visit reviewed. Allergies and medications reviewed and updated.  Review of Systems  Constitutional: Negative.   Respiratory: Negative.   Cardiovascular: Negative.   Gastrointestinal: Negative.   Musculoskeletal: Negative.   Neurological: Positive for numbness. Negative for dizziness, tremors, seizures, syncope, facial asymmetry, speech difficulty, weakness, light-headedness and headaches.  Psychiatric/Behavioral: Negative.     Per HPI unless specifically indicated above     Objective:    BP (!) 151/80   Pulse 66   Temp 97.9 F (36.6 C)   Ht 5' 8.5" (1.74 m)   Wt 226 lb 3.2 oz (102.6 kg)   SpO2 96%   BMI 33.89 kg/m   Wt Readings from Last 3 Encounters:  10/06/20 226 lb 3.2 oz (102.6 kg)  08/04/20 222 lb (100.7 kg)  06/09/20 220 lb (99.8 kg)    Physical Exam Vitals and nursing note reviewed.  Constitutional:      General: He is not in acute distress.    Appearance: Normal appearance. He is not  ill-appearing, toxic-appearing or diaphoretic.  HENT:     Head: Normocephalic and atraumatic.     Right Ear: External ear normal.     Left Ear: External ear normal.     Nose: Nose normal.     Mouth/Throat:     Mouth: Mucous membranes are moist.     Pharynx: Oropharynx is clear.  Eyes:     General: No scleral icterus.       Right eye: No discharge.        Left eye: No discharge.     Extraocular Movements: Extraocular movements intact.     Conjunctiva/sclera: Conjunctivae normal.     Pupils: Pupils are equal, round, and reactive to light.  Cardiovascular:     Rate and Rhythm: Normal rate and regular rhythm.     Pulses: Normal pulses.     Heart sounds: Normal heart sounds. No murmur heard. No friction rub. No gallop.   Pulmonary:     Effort: Pulmonary effort is normal. No respiratory distress.     Breath sounds: Normal breath sounds. No stridor. No wheezing, rhonchi or rales.  Chest:     Chest wall: No tenderness.  Musculoskeletal:        General: Normal range of motion.     Cervical back: Normal range of motion and neck supple.  Skin:    General: Skin is warm and dry.     Capillary Refill: Capillary refill takes less than 2  seconds.     Coloration: Skin is not jaundiced or pale.     Findings: No bruising, erythema, lesion or rash.  Neurological:     General: No focal deficit present.     Mental Status: He is alert and oriented to person, place, and time. Mental status is at baseline.  Psychiatric:        Mood and Affect: Mood normal.        Behavior: Behavior normal.        Thought Content: Thought content normal.        Judgment: Judgment normal.     Results for orders placed or performed in visit on 06/09/20  Basic metabolic panel  Result Value Ref Range   Glucose 106 (H) 65 - 99 mg/dL   BUN 13 6 - 24 mg/dL   Creatinine, Ser 5.44 0.76 - 1.27 mg/dL   GFR calc non Af Amer 95 >59 mL/min/1.73   GFR calc Af Amer 110 >59 mL/min/1.73   BUN/Creatinine Ratio 15 9 - 20    Sodium 141 134 - 144 mmol/L   Potassium 4.0 3.5 - 5.2 mmol/L   Chloride 101 96 - 106 mmol/L   CO2 27 20 - 29 mmol/L   Calcium 9.0 8.7 - 10.2 mg/dL      Assessment & Plan:   Problem List Items Addressed This Visit      Nervous and Auditory   Peripheral neuropathy - Primary    Doing better. Stable on medicine. Only taking 2 requip a day rather than 3. Tolerating cymbalta well. Following with chiropractry. Continue current regimen. Up to date on refills. Follow up in August for his physical. Call with any concerns.          Follow up plan: Return August- physical with Clydie Braun.

## 2020-11-23 ENCOUNTER — Other Ambulatory Visit: Payer: Self-pay | Admitting: Family Medicine

## 2020-11-23 NOTE — Telephone Encounter (Signed)
last RF 06/09/20 #90 1 RF- pt has med to last to OV 12/08/20

## 2020-12-08 ENCOUNTER — Encounter: Payer: 59 | Admitting: Nurse Practitioner

## 2020-12-08 NOTE — Progress Notes (Deleted)
There were no vitals taken for this visit.   Subjective:    Patient ID: Jesse Diaz, male    DOB: 1960-05-04, 60 y.o.   MRN: [id number]  HPI: Jesse Diaz is a 60 y.o. male presenting on 12/08/2020 for comprehensive medical examination. Current medical complaints include:{Blank single:19197::"none","***"}  He currently lives with: Interim Problems from his last visit: {Blank single:19197::"yes","no"}  HYPERTENSION Hypertension status: {Blank single:19197::"controlled","uncontrolled","better","worse","exacerbated","stable"}  Satisfied with current treatment? {Blank single:19197::"yes","no"} Duration of hypertension: {Blank single:19197::"chronic","months","years"} BP monitoring frequency:  {Blank single:19197::"not checking","rarely","daily","weekly","monthly","a few times a day","a few times a week","a few times a month"} BP range:  BP medication side effects:  {Blank single:19197::"yes","no"} Medication compliance: {Blank single:19197::"excellent compliance","good compliance","fair compliance","poor compliance"} Previous BP meds:{Blank multiple:19196::"none","amlodipine","amlodipine/benazepril","atenolol","benazepril","benazepril/HCTZ","bisoprolol (bystolic)","carvedilol","chlorthalidone","clonidine","diltiazem","exforge HCT","HCTZ","irbesartan (avapro)","labetalol","lisinopril","lisinopril-HCTZ","losartan (cozaar)","methyldopa","nifedipine","olmesartan (benicar)","olmesartan-HCTZ","quinapril","ramipril","spironalactone","tekturna","valsartan","valsartan-HCTZ","verapamil"} Aspirin: {Blank single:19197::"yes","no"} Recurrent headaches: {Blank single:19197::"yes","no"} Visual changes: {Blank single:19197::"yes","no"} Palpitations: {Blank single:19197::"yes","no"} Dyspnea: {Blank single:19197::"yes","no"} Chest pain: {Blank single:19197::"yes","no"} Lower extremity edema: {Blank single:19197::"yes","no"} Dizzy/lightheaded: {Blank single:19197::"yes","no"}  DEPRESSION   Depression  Screen done today and results listed below:  Depression screen [facility] 2/9 10/06/2020 06/09/2020 12/19/2018 03/02/2018 11/21/2017  Decreased Interest 0 0 0 1 1  Down, Depressed, Hopeless 0 0 0 1 1  PHQ - 2 Score 0 0 0 2 2  Altered sleeping 1 0 1 1 1   Tired, decreased energy 1 0 2 1 1   Change in appetite 0 0 1 1 0  Feeling bad or failure about yourself  0 0 0 1 0  Trouble concentrating 0 0 0 1 0  Moving slowly or fidgety/restless 0 0 0 0 -  Suicidal thoughts 0 0 0 0 -  PHQ-9 Score 2 0 4 7 4   Difficult doing work/chores Not difficult at all Not difficult at all - - -    The patient {has/does not a history of falls. I {did/did not:19850} complete a risk assessment for falls. A plan of care for falls {was/was not:19852} documented.   Past Medical History:  Past Medical History:  Diagnosis Date   Anxiety    Asthma    Depression    GERD (gastroesophageal reflux disease)    in past   Hypertension    Sinus drainage    Wears dentures    partial lower    Surgical History:  Past Surgical History:  Procedure Laterality Date   CARDIOVASCULAR STRESS TEST     approx 8 yrs ago - anxiety/reflux   COLONOSCOPY     COLONOSCOPY WITH PROPOFOL N/A 04/02/2016   Procedure: COLONOSCOPY WITH PROPOFOL;  Surgeon: , MD;  Location: [facility] ENDOSCOPY;  Service: Endoscopy;  Laterality: N/A;   ESOPHAGEAL DILATION     striction   ESOPHAGOGASTRODUODENOSCOPY (EGD) WITH PROPOFOL N/A 04/02/2016   Procedure: ESOPHAGOGASTRODUODENOSCOPY (EGD) WITH PROPOFOL;  Surgeon: Scot Jun, MD;  Location: [facility] ENDOSCOPY;  Service: Endoscopy;  Laterality: N/A;   EXTRACORPOREAL SHOCK WAVE LITHOTRIPSY Right 12/25/2015   Procedure: EXTRACORPOREAL SHOCK WAVE LITHOTRIPSY (ESWL);  Surgeon: Scot Jun, MD;  Location: [facility] ORS;  Service: Urology;  Laterality: Right;   KNEE ARTHROSCOPY Bilateral    KNEE ARTHROSCOPY     SEPTOPLASTY N/A 04/04/2015   Procedure: SEPTOPLASTY;  Surgeon: 12/27/2015, MD;   Location: [facility];  Service: ENT;  Laterality: N/A;   TONSILLECTOMY     TURBINATE REDUCTION N/A 04/04/2015   Procedure: TURBINATE REDUCTION SMR;  Surgeon: Linus Salmons, MD;  Location: [facility];  Service: ENT;  Laterality: N/A;   UPPER GI ENDOSCOPY      Medications:  Current Outpatient  Medications on File Prior to Visit  Medication Sig   albuterol (VENTOLIN HFA) 108 (90 Base) MCG/ACT inhaler TAKE 2 PUFFS BY MOUTH EVERY 6 HOURS AS NEEDED FOR WHEEZE OR SHORTNESS OF BREATH   Biotin 61443 MCG TABS Take 10,000 mg by mouth daily.   CALCIUM CITRATE PO Take 750 mg by mouth daily.   co-enzyme Q-10 30 MG capsule Take 30 mg by mouth daily.   DULoxetine (CYMBALTA) 20 MG capsule TAKE ONE CAPSULE BY MOUTH DAILY FOR 1 TO 2 WEEKS, 2 CAPSULES DAILY FOR 1 TO 2 WEEKS, THEN CONTINUE AT 3 CAPSULES DAILY   furosemide (LASIX) 20 MG tablet Take 1 tablet (20 mg total) by mouth daily.   losartan-hydrochlorothiazide (HYZAAR) 50-12.5 MG tablet Take 1 tablet by mouth daily.   Magnesium 250 MG TABS Take 250 mg by mouth 2 (two) times daily.   meloxicam (MOBIC) 7.5 MG tablet Take 7.5 mg by mouth daily.   Potassium 99 MG TABS Take 99 mg by mouth daily.   rOPINIRole (REQUIP) 0.5 MG tablet Take 1 tablet (0.5 mg total) by mouth 3 (three) times daily.   TURMERIC PO Take by mouth.   UNABLE TO FIND Super Oxicell   No current facility-administered medications on file prior to visit.    Allergies:  No Known Allergies  Social History:  Social History   Socioeconomic History   Marital status: Married    Spouse name: Not on file   Number of children: Not on file   Years of education: Not on file   Highest education level: Not on file  Occupational History   Not on file  Tobacco Use   Smoking status: Never   Smokeless tobacco: Former    Quit date: 05/03/2006   Tobacco comments:    quit chew - approx 2008  Vaping Use   Vaping Use: Never used  Substance and Sexual Activity   Alcohol use: No    Drug use: No   Sexual activity: Not Currently  Other Topics Concern   Not on file  Social History Narrative   Not on file   Social Determinants of Health   Financial Resource Strain: Not on file  Food Insecurity: Not on file  Transportation Needs: Not on file  Physical Activity: Not on file  Stress: Not on file  Social Connections: Not on file  Intimate Partner Violence: Not on file   Social History   Tobacco Use  Smoking Status Never  Smokeless Tobacco Former   Quit date: 05/03/2006  Tobacco Comments   quit chew - approx 2008   Social History   Substance and Sexual Activity  Alcohol Use No    Family History:  Family History  Problem Relation Age of Onset   Stroke Mother    Colon polyps Mother    Hypertension Mother    Leukemia Father    Cancer Father        prostate   Heart attack Father    Stroke Father    Colon polyps Sister    Cancer Maternal Aunt    Diabetes Maternal Aunt    Hypertension Maternal Aunt    Cancer Maternal Uncle        prostate   Diabetes Maternal Uncle    Hypertension Maternal Uncle     Past medical history, surgical history, medications, allergies, family history and social history reviewed with patient today and changes made to appropriate areas of the chart.   ROS All other ROS negative except what is listed  above and in the HPI.      Objective:    There were no vitals taken for this visit.  Wt Readings from Last 3 Encounters:  10/06/20 226 lb 3.2 oz (102.6 kg)  08/04/20 222 lb (100.7 kg)  06/09/20 220 lb (99.8 kg)    Physical Exam  Results for orders placed or performed in visit on 06/09/20  Basic metabolic panel  Result Value Ref Range   Glucose 106 (H) 65 - 99 mg/dL   BUN 13 6 - 24 mg/dL   Creatinine, Ser 7.51 0.76 - 1.27 mg/dL   GFR calc non Af Amer 95 >59 mL/min/1.73   GFR calc Af Amer 110 >59 mL/min/1.73   BUN/Creatinine Ratio 15 9 - 20   Sodium 141 134 - 144 mmol/L   Potassium 4.0 3.5 - 5.2 mmol/L    Chloride 101 96 - 106 mmol/L   CO2 27 20 - 29 mmol/L   Calcium 9.0 8.7 - 10.2 mg/dL      Assessment & Plan:   Problem List Items Addressed This Visit       Cardiovascular and Mediastinum   Benign essential hypertension     Nervous and Auditory   Peripheral neuropathy     Other   Depression with anxiety   Other Visit Diagnoses     Annual physical exam    -  Primary        Discussed aspirin prophylaxis for myocardial infarction prevention and decision was {Blank single:19197::"it was not indicated","made to continue ASA","made to start ASA","made to stop ASA","that we recommended ASA, and patient refused"}  LABORATORY TESTING:  Health maintenance labs ordered today as discussed above.   The natural history of prostate cancer and ongoing controversy regarding screening and potential treatment outcomes of prostate cancer has been discussed with the patient. The meaning of a false positive PSA and a false negative PSA has been discussed. He indicates understanding of the limitations of this screening test and wishes *** to proceed with screening PSA testing.   IMMUNIZATIONS:   - Tdap: Tetanus vaccination status reviewed: {tetanus status:315746}. - Influenza: {Blank single:19197::"Up to date","Administered today","Postponed to flu season","Refused","Given elsewhere"} - Pneumovax: {Blank single:19197::"Up to date","Administered today","Not applicable","Refused","Given elsewhere"} - Prevnar: {Blank single:19197::"Up to date","Administered today","Not applicable","Refused","Given elsewhere"} - HPV: {Blank single:19197::"Up to date","Administered today","Not applicable","Refused","Given elsewhere"} - Zostavax vaccine: {Blank single:19197::"Up to date","Administered today","Not applicable","Refused","Given elsewhere"}  SCREENING: - Colonoscopy: {Blank single:19197::"Up to date","Ordered today","Not applicable","Refused","Done elsewhere"}  Discussed with patient purpose of the  colonoscopy is to detect colon cancer at curable precancerous or early stages   - AAA Screening: {Blank single:19197::"Up to date","Ordered today","Not applicable","Refused","Done elsewhere"}  -Hearing Test: {Blank single:19197::"Up to date","Ordered today","Not applicable","Refused","Done elsewhere"}  -Spirometry: {Blank single:19197::"Up to date","Ordered today","Not applicable","Refused","Done elsewhere"}   PATIENT COUNSELING:    Sexuality: Discussed sexually transmitted diseases, partner selection, use of condoms, avoidance of unintended pregnancy  and contraceptive alternatives.   Advised to avoid cigarette smoking.  I discussed with the patient that most people either abstain from alcohol or drink within safe limits (<=14/week and <=4 drinks/occasion for males, <=7/weeks and <= 3 drinks/occasion for females) and that the risk for alcohol disorders and other health effects rises proportionally with the number of drinks per week and how often a drinker exceeds daily limits.  Discussed cessation/primary prevention of drug use and availability of treatment for abuse.   Diet: Encouraged to adjust caloric intake to maintain  or achieve ideal body weight, to reduce intake of dietary saturated fat and total fat, to  limit sodium intake by avoiding high sodium foods and not adding table salt, and to maintain adequate dietary potassium and calcium preferably from fresh fruits, vegetables, and low-fat dairy products.    stressed the importance of regular exercise  Injury prevention: Discussed safety belts, safety helmets, smoke detector, smoking near bedding or upholstery.   Dental health: Discussed importance of regular tooth brushing, flossing, and dental visits.   Follow up plan: NEXT PREVENTATIVE PHYSICAL DUE IN 1 YEAR. No follow-ups on file.

## 2020-12-23 ENCOUNTER — Other Ambulatory Visit: Payer: Self-pay | Admitting: Nurse Practitioner

## 2020-12-23 MED ORDER — LOSARTAN POTASSIUM-HCTZ 50-12.5 MG PO TABS: 1.0000 | ORAL_TABLET | Freq: Every day | ORAL | 1 refills | Status: DC

## 2020-12-23 NOTE — Telephone Encounter (Signed)
  Notes to clinic:  Patient has appt on 01/19/2021   Review for refill    Requested Prescriptions  Pending Prescriptions Disp Refills   losartan-hydrochlorothiazide (HYZAAR) 50-12.5 MG tablet 90 tablet 1    Sig: Take 1 tablet by mouth daily.     Cardiovascular: ARB + Diuretic Combos Failed - 12/23/2020  3:23 PM      Failed - K in normal range and within 180 days    Potassium  Date Value Ref Range Status  06/09/2020 4.0 3.5 - 5.2 mmol/L Final          Failed - Na in normal range and within 180 days    Sodium  Date Value Ref Range Status  06/09/2020 141 134 - 144 mmol/L Final          Failed - Cr in normal range and within 180 days    Creatinine, Ser  Date Value Ref Range Status  06/09/2020 0.85 0.76 - 1.27 mg/dL Final          Failed - Ca in normal range and within 180 days    Calcium  Date Value Ref Range Status  06/09/2020 9.0 8.7 - 10.2 mg/dL Final   Calcium, Ion  Date Value Ref Range Status  05/10/2015 1.13 1.12 - 1.23 mmol/L Final          Failed - Last BP in normal range    BP Readings from Last 1 Encounters:  10/06/20 (!) 151/80          Passed - Patient is not pregnant      Passed - Valid encounter within last 6 months    Recent Outpatient Visits           2 months ago Peripheral polyneuropathy   [facility], [place], DO   4 months ago Peripheral polyneuropathy   [facility] Larae Grooms, NP   6 months ago Benign essential hypertension   [facility] [place], [place], DO   1 year ago Benign essential hypertension   [facility] Particia Nearing, [place]   2 years ago Benign essential hypertension   [facility] [place], Salley Hews, [place]       Future Appointments             In 3 weeks Larae Grooms, NP [facility], [facility]

## 2020-12-23 NOTE — Telephone Encounter (Signed)
Medication Refill - Medication: Losartan-Hydrochlorothiazide   Has the patient contacted their pharmacy? No. Pt states that he was contacted by pharmacy stating that he has no refills and that he needs to contact PCP. Pt has an appt with PCP on 01/19/21. Please advise.  (Agent: If no, request that the patient contact the pharmacy for the refill.) (Agent: If yes, when and what did the pharmacy advise?)  Preferred Pharmacy (with phone number or street name):  [facility] - [place], [place] - [address] AT [facility]  [address] [place] [address]  Phone: [phone] Fax: [phone]  Hours: Not open 24 hours   Agent: Please be advised that RX refills may take up to 3 business days. We ask that you follow-up with your pharmacy.

## 2020-12-23 NOTE — Telephone Encounter (Signed)
Patient last seen 10/06/20 and has appointment 01/19/21

## 2021-01-15 ENCOUNTER — Telehealth: Payer: Self-pay | Admitting: Nurse Practitioner

## 2021-01-15 NOTE — Telephone Encounter (Signed)
Patient called and advised the requested Losartan was sent to [facility] [place] on 12/23/20 #90/1 refill, he verbalized understanding and says he will go by to pick it up tonight. [facility] called and spoke to Fayrene Fearing, Pensions consultant about the refill(s) Losartan-HCTZ requested. Advised it was sent on 12/23/20 #90/1 refill(s). Jancarlos says they have it on file and he will refill for the patient.

## 2021-01-15 NOTE — Telephone Encounter (Signed)
Medication Refill - Medication: Losartan   Has the patient contacted their pharmacy? Yes.   Pt states that the pharmacy says there are no refills and pt needs to contact PCP. Pt has an appt on 01/19/21. Pt states that he is needing to have a short supply sent to the [facility] drug store until he is able to receive the prescription [facility]. Please advise.  (Agent: If no, request that the patient contact the pharmacy for the refill.) (Agent: If yes, when and what did the pharmacy advise?)  Preferred Pharmacy (with phone number or street name):   Agent: Please be [facility] - [place], [place] - [address] AT [facility]  [address] [place] [address]  Phone: [phone] Fax: [phone]  Hours: Not open 24 hours   advised that RX refills may take up to 3 business days. We ask that you follow-up with your pharmacy.  [id number] [place], [place] - [address] AT [facility] [address]  [address] [place] [address]  Phone: [phone] Fax: [phone]  Hours: Not open 24 hours

## 2021-01-16 NOTE — Progress Notes (Signed)
BP 137/78   Pulse 61   Temp 99.1 F (37.3 C) (Oral)   Ht 5\' 9"  (1.753 m)   Wt 218 lb 9.6 oz (99.2 kg)   SpO2 97%   BMI 32.28 kg/m    Subjective:    Patient ID: Jesse Diaz, male    DOB: Nov 24, 1960, 60 y.o.   MRN: [id number]  HPI: Jesse Diaz is a 60 y.o. male presenting on 01/19/2021 for comprehensive medical examination. Current medical complaints include: sinus congestion  He currently lives with: Interim Problems from his last visit: no  HYPERTENSION Hypertension status: controlled  Satisfied with current treatment? no Duration of hypertension: years BP monitoring frequency:  not checking BP range:  BP medication side effects:  no Medication compliance: excellent compliance Previous BP meds: lasix, HCTZ, and losartan (cozaar) Aspirin: no Recurrent headaches: no Visual changes: no Palpitations: no Dyspnea: no Chest pain: no Lower extremity edema: no Dizzy/lightheaded: no  NEUROPATHY Patient states he has good days and bad days.  He has states his feet are freezing at night.  He has started sleeping with socks on.  Patient is getting treatment for his Neuropathy.   DEPRESSION/ANXIETY Patient states he is doing well. Patient states he isn't sure the Cymbalta is helping at all.  Feels like having to take medications and the neuropathy contribute to his depression.  Denies SI.   Depression Screen done today and results listed below:  Depression screen [facility] 2/9 01/19/2021 10/06/2020 06/09/2020 12/19/2018 03/02/2018  Decreased Interest 0 0 0 0 1  Down, Depressed, Hopeless 0 0 0 0 1  PHQ - 2 Score 0 0 0 0 2  Altered sleeping 3 1 0 1 1  Tired, decreased energy 1 1 0 2 1  Change in appetite 1 0 0 1 1  Feeling bad or failure about yourself  0 0 0 0 1  Trouble concentrating 0 0 0 0 1  Moving slowly or fidgety/restless 0 0 0 0 0  Suicidal thoughts 0 0 0 0 0  PHQ-9 Score 5 2 0 4 7  Difficult doing work/chores Not difficult at all Not difficult at all Not difficult at all -  -    The patient does not have a history of falls. I did complete a risk assessment for falls. A plan of care for falls was documented.   Past Medical History:  Past Medical History:  Diagnosis Date   Anxiety    Asthma    Depression    GERD (gastroesophageal reflux disease)    in past   Hypertension    Sinus drainage    Wears dentures    partial lower    Surgical History:  Past Surgical History:  Procedure Laterality Date   CARDIOVASCULAR STRESS TEST     approx 8 yrs ago - anxiety/reflux   COLONOSCOPY     COLONOSCOPY WITH PROPOFOL N/A 04/02/2016   Procedure: COLONOSCOPY WITH PROPOFOL;  Surgeon: Scot Jun, MD;  Location: [facility] ENDOSCOPY;  Service: Endoscopy;  Laterality: N/A;   ESOPHAGEAL DILATION     striction   ESOPHAGOGASTRODUODENOSCOPY (EGD) WITH PROPOFOL N/A 04/02/2016   Procedure: ESOPHAGOGASTRODUODENOSCOPY (EGD) WITH PROPOFOL;  Surgeon: Scot Jun, MD;  Location: [facility] ENDOSCOPY;  Service: Endoscopy;  Laterality: N/A;   EXTRACORPOREAL SHOCK WAVE LITHOTRIPSY Right 12/25/2015   Procedure: EXTRACORPOREAL SHOCK WAVE LITHOTRIPSY (ESWL);  Surgeon: Orson Ape, MD;  Location: [facility] ORS;  Service: Urology;  Laterality: Right;   KNEE ARTHROSCOPY Bilateral    KNEE ARTHROSCOPY  SEPTOPLASTY N/A 04/04/2015   Procedure: SEPTOPLASTY;  Surgeon: Linus Salmons, MD;  Location: [facility]ERY [facility]TR;  Service: ENT;  Laterality: N/A;   TONSILLECTOMY     TURBINATE REDUCTION N/A 04/04/2015   Procedure: TURBINATE REDUCTION SMR;  Surgeon: Linus Salmons, MD;  Location: [facility];  Service: ENT;  Laterality: N/A;   UPPER GI ENDOSCOPY      Medications:  Current Outpatient Medications on File Prior to Visit  Medication Sig   albuterol (VENTOLIN HFA) 108 (90 Base) MCG/ACT inhaler TAKE 2 PUFFS BY MOUTH EVERY 6 HOURS AS NEEDED FOR WHEEZE OR SHORTNESS OF BREATH   Biotin 13244 MCG TABS Take 10,000 mg by mouth daily.   CALCIUM CITRATE PO Take 750 mg by mouth daily.    co-enzyme Q-10 30 MG capsule Take 30 mg by mouth daily.   furosemide (LASIX) 20 MG tablet Take 1 tablet (20 mg total) by mouth daily.   Magnesium 250 MG TABS Take 250 mg by mouth 2 (two) times daily.   meloxicam (MOBIC) 7.5 MG tablet Take 7.5 mg by mouth daily.   Potassium 99 MG TABS Take 99 mg by mouth daily.   rOPINIRole (REQUIP) 0.5 MG tablet Take 1 tablet (0.5 mg total) by mouth 3 (three) times daily.   TURMERIC PO Take by mouth.   UNABLE TO FIND Super Oxicell   No current facility-administered medications on file prior to visit.    Allergies:  No Known Allergies  Social History:  Social History   Socioeconomic History   Marital status: Married    Spouse name: Not on file   Number of children: Not on file   Years of education: Not on file   Highest education level: Not on file  Occupational History   Not on file  Tobacco Use   Smoking status: Never   Smokeless tobacco: Former    Quit date: 05/03/2006   Tobacco comments:    quit chew - approx 2008  Vaping Use   Vaping Use: Never used  Substance and Sexual Activity   Alcohol use: No   Drug use: No   Sexual activity: Not Currently  Other Topics Concern   Not on file  Social History Narrative   Not on file   Social Determinants of Health   Financial Resource Strain: Not on file  Food Insecurity: Not on file  Transportation Needs: Not on file  Physical Activity: Not on file  Stress: Not on file  Social Connections: Not on file  Intimate Partner Violence: Not on file   Social History   Tobacco Use  Smoking Status Never  Smokeless Tobacco Former   Quit date: 05/03/2006  Tobacco Comments   quit chew - approx 2008   Social History   Substance and Sexual Activity  Alcohol Use No    Family History:  Family History  Problem Relation Age of Onset   Stroke Mother    Colon polyps Mother    Hypertension Mother    Leukemia Father    Cancer Father        prostate   Heart attack Father    Stroke Father     Colon polyps Sister    Cancer Maternal Aunt    Diabetes Maternal Aunt    Hypertension Maternal Aunt    Cancer Maternal Uncle        prostate   Diabetes Maternal Uncle    Hypertension Maternal Uncle     Past medical history, surgical history, medications, allergies, family history and  social history reviewed with patient today and changes made to appropriate areas of the chart.   Review of Systems  Eyes:  Negative for blurred vision and double vision.  Respiratory:  Negative for shortness of breath.   Cardiovascular:  Negative for chest pain, palpitations and leg swelling.  Neurological:  Positive for tingling. Negative for dizziness and headaches.  Psychiatric/Behavioral:  Positive for depression. Negative for suicidal ideas.   All other ROS negative except what is listed above and in the HPI.      Objective:    BP 137/78   Pulse 61   Temp 99.1 F (37.3 C) (Oral)   Ht 5\' 9"  (1.753 m)   Wt 218 lb 9.6 oz (99.2 kg)   SpO2 97%   BMI 32.28 kg/m   Wt Readings from Last 3 Encounters:  01/19/21 218 lb 9.6 oz (99.2 kg)  10/06/20 226 lb 3.2 oz (102.6 kg)  08/04/20 222 lb (100.7 kg)    Physical Exam Vitals and nursing note reviewed.  Constitutional:      General: He is not in acute distress.    Appearance: Normal appearance. He is not ill-appearing, toxic-appearing or diaphoretic.  HENT:     Head: Normocephalic.     Right Ear: Tympanic membrane, ear canal and external ear normal.     Left Ear: Tympanic membrane, ear canal and external ear normal.     Nose: Nose normal. No congestion or rhinorrhea.     Mouth/Throat:     Mouth: Mucous membranes are moist.  Eyes:     General:        Right eye: No discharge.        Left eye: No discharge.     Extraocular Movements: Extraocular movements intact.     Conjunctiva/sclera: Conjunctivae normal.     Pupils: Pupils are equal, round, and reactive to light.  Cardiovascular:     Rate and Rhythm: Normal rate and regular rhythm.      Heart sounds: No murmur heard. Pulmonary:     Effort: Pulmonary effort is normal. No respiratory distress.     Breath sounds: Normal breath sounds. No wheezing, rhonchi or rales.  Abdominal:     General: Abdomen is flat. Bowel sounds are normal. There is no distension.     Palpations: Abdomen is soft.     Tenderness: There is no abdominal tenderness. There is no guarding.  Musculoskeletal:     Cervical back: Normal range of motion and neck supple.  Skin:    General: Skin is warm and dry.     Capillary Refill: Capillary refill takes less than 2 seconds.  Neurological:     General: No focal deficit present.     Mental Status: He is alert and oriented to person, place, and time.     Cranial Nerves: No cranial nerve deficit.     Motor: No weakness.     Deep Tendon Reflexes: Reflexes normal.  Psychiatric:        Mood and Affect: Mood normal.        Behavior: Behavior normal.        Thought Content: Thought content normal.        Judgment: Judgment normal.    Results for orders placed or performed in visit on 06/09/20  Basic metabolic panel  Result Value Ref Range   Glucose 106 (H) 65 - 99 mg/dL   BUN 13 6 - 24 mg/dL   Creatinine, Ser 08/07/20 0.76 - 1.27 mg/dL   GFR  calc non Af Amer 95 >59 mL/min/1.73   GFR calc Af Amer 110 >59 mL/min/1.73   BUN/Creatinine Ratio 15 9 - 20   Sodium 141 134 - 144 mmol/L   Potassium 4.0 3.5 - 5.2 mmol/L   Chloride 101 96 - 106 mmol/L   CO2 27 20 - 29 mmol/L   Calcium 9.0 8.7 - 10.2 mg/dL      Assessment & Plan:   Problem List Items Addressed This Visit       Cardiovascular and Mediastinum   Benign essential hypertension    Chronic.  Controlled.  Continue with current medication regimen.  Labs ordered today. Refills sent today. Return to clinic in 6 months for reevaluation.  Call sooner if concerns arise.        Relevant Medications   losartan-hydrochlorothiazide (HYZAAR) 50-12.5 MG tablet     Nervous and Auditory   Peripheral neuropathy     Chronic.  Ongoing.  Continue with current medication regimen.  Labs ordered today.  Return to clinic in 6 months for reevaluation.  Call sooner if concerns arise.        Relevant Medications   DULoxetine (CYMBALTA) 20 MG capsule     Other   Depression with anxiety    Chronic.  Controlled.  Continue with current medication regimen of Duloxetine 20mg  TID.  Labs ordered today.  Refills sent today. Return to clinic in 6 months for reevaluation.  Call sooner if concerns arise.        Relevant Medications   DULoxetine (CYMBALTA) 20 MG capsule   Other Visit Diagnoses     Annual physical exam    -  Primary   Health maintenance reviewed during visit today.  Labs ordered today.     Relevant Orders   TSH   PSA   Lipid panel   CBC with Differential/Platelet   Comprehensive metabolic panel   Urinalysis, Routine w reflex microscopic        Discussed aspirin prophylaxis for myocardial infarction prevention and decision was it was not indicated  LABORATORY TESTING:  Health maintenance labs ordered today as discussed above.   The natural history of prostate cancer and ongoing controversy regarding screening and potential treatment outcomes of prostate cancer has been discussed with the patient. The meaning of a false positive PSA and a false negative PSA has been discussed. He indicates understanding of the limitations of this screening test and wishes to proceed with screening PSA testing.   IMMUNIZATIONS:   - Tdap: Tetanus vaccination status reviewed: last tetanus booster within 10 years. - Influenza: Refused - Pneumovax: Not applicable - Prevnar: Not applicable - HPV: Not applicable - Zostavax vaccine: Refused  SCREENING: - Colonoscopy: Up to date  Discussed with patient purpose of the colonoscopy is to detect colon cancer at curable precancerous or early stages   - AAA Screening: Not applicable  -Hearing Test: Not applicable  -Spirometry: Not applicable   PATIENT  COUNSELING:    Sexuality: Discussed sexually transmitted diseases, partner selection, use of condoms, avoidance of unintended pregnancy  and contraceptive alternatives.   Advised to avoid cigarette smoking.  I discussed with the patient that most people either abstain from alcohol or drink within safe limits (<=14/week and <=4 drinks/occasion for males, <=7/weeks and <= 3 drinks/occasion for females) and that the risk for alcohol disorders and other health effects rises proportionally with the number of drinks per week and how often a drinker exceeds daily limits.  Discussed cessation/primary prevention of drug use and availability  of treatment for abuse.   Diet: Encouraged to adjust caloric intake to maintain  or achieve ideal body weight, to reduce intake of dietary saturated fat and total fat, to limit sodium intake by avoiding high sodium foods and not adding table salt, and to maintain adequate dietary potassium and calcium preferably from fresh fruits, vegetables, and low-fat dairy products.    stressed the importance of regular exercise  Injury prevention: Discussed safety belts, safety helmets, smoke detector, smoking near bedding or upholstery.   Dental health: Discussed importance of regular tooth brushing, flossing, and dental visits.   Follow up plan: NEXT PREVENTATIVE PHYSICAL DUE IN 1 YEAR. Return in about 6 months (around 07/19/2021) for HTN, HLD, DM2 FU.

## 2021-01-19 ENCOUNTER — Encounter: Payer: 59 | Admitting: Nurse Practitioner

## 2021-01-19 ENCOUNTER — Ambulatory Visit (INDEPENDENT_AMBULATORY_CARE_PROVIDER_SITE_OTHER): Payer: 59 | Admitting: Nurse Practitioner

## 2021-01-19 ENCOUNTER — Other Ambulatory Visit: Payer: Self-pay

## 2021-01-19 ENCOUNTER — Encounter: Payer: Self-pay | Admitting: Nurse Practitioner

## 2021-01-19 VITALS — BP 137/78 | HR 61 | Temp 99.1°F | Ht 69.0 in | Wt 218.6 lb

## 2021-01-19 DIAGNOSIS — Z0000 Encounter for general adult medical examination without abnormal findings: Secondary | ICD-10-CM | POA: Diagnosis not present

## 2021-01-19 DIAGNOSIS — I10 Essential (primary) hypertension: Secondary | ICD-10-CM

## 2021-01-19 DIAGNOSIS — G629 Polyneuropathy, unspecified: Secondary | ICD-10-CM | POA: Diagnosis not present

## 2021-01-19 DIAGNOSIS — F418 Other specified anxiety disorders: Secondary | ICD-10-CM | POA: Diagnosis not present

## 2021-01-19 LAB — URINALYSIS, ROUTINE W REFLEX MICROSCOPIC
Bilirubin, UA: NEGATIVE
Glucose, UA: NEGATIVE
Ketones, UA: NEGATIVE
Leukocytes,UA: NEGATIVE
Nitrite, UA: NEGATIVE
RBC, UA: NEGATIVE
Specific Gravity, UA: 1.025 (ref 1.005–1.030)
Urobilinogen, Ur: 0.2 mg/dL (ref 0.2–1.0)
pH, UA: 7 (ref 5.0–7.5)

## 2021-01-19 MED ORDER — LOSARTAN POTASSIUM-HCTZ 50-12.5 MG PO TABS: 1.0000 | ORAL_TABLET | Freq: Every day | ORAL | 1 refills | Status: DC

## 2021-01-19 MED ORDER — DULOXETINE HCL 20 MG PO CPEP: ORAL_CAPSULE | ORAL | 1 refills | Status: DC

## 2021-01-19 NOTE — Assessment & Plan Note (Signed)
Chronic.  Controlled.  Continue with current medication regimen.  Labs ordered today.  Refills sent today.  Return to clinic in 6 months for reevaluation.  Call sooner if concerns arise.   

## 2021-01-19 NOTE — Assessment & Plan Note (Signed)
Chronic.  Ongoing.  Continue with current medication regimen.  Labs ordered today.  Return to clinic in 6 months for reevaluation.  Call sooner if concerns arise.

## 2021-01-19 NOTE — Progress Notes (Signed)
Hi Jesse Diaz. Your urine from today looks good.  I will send you another message once the rest of your lab work comes back.  °

## 2021-01-19 NOTE — Assessment & Plan Note (Signed)
Chronic.  Controlled.  Continue with current medication regimen of Duloxetine 20mg  TID.  Labs ordered today.  Refills sent today. Return to clinic in 6 months for reevaluation.  Call sooner if concerns arise.

## 2021-01-20 LAB — COMPREHENSIVE METABOLIC PANEL
ALT: 20 IU/L (ref 0–44)
AST: 19 IU/L (ref 0–40)
Albumin/Globulin Ratio: 1.4 (ref 1.2–2.2)
Albumin: 4 g/dL (ref 3.8–4.9)
Alkaline Phosphatase: 80 IU/L (ref 44–121)
BUN/Creatinine Ratio: 16 (ref 9–20)
BUN: 14 mg/dL (ref 6–24)
Bilirubin Total: 0.6 mg/dL (ref 0.0–1.2)
CO2: 24 mmol/L (ref 20–29)
Calcium: 8.9 mg/dL (ref 8.7–10.2)
Chloride: 103 mmol/L (ref 96–106)
Creatinine, Ser: 0.88 mg/dL (ref 0.76–1.27)
Globulin, Total: 2.9 g/dL (ref 1.5–4.5)
Glucose: 99 mg/dL (ref 65–99)
Potassium: 4.1 mmol/L (ref 3.5–5.2)
Sodium: 140 mmol/L (ref 134–144)
Total Protein: 6.9 g/dL (ref 6.0–8.5)
eGFR: 99 mL/min/{1.73_m2} (ref >59)

## 2021-01-20 LAB — CBC WITH DIFFERENTIAL/PLATELET
Basophils Absolute: 0.1 10*3/uL (ref 0.0–0.2)
Basos: 1 %
EOS (ABSOLUTE): 0.6 10*3/uL — ABNORMAL HIGH (ref 0.0–0.4)
Eos: 6 %
Hematocrit: 44.5 % (ref 37.5–51.0)
Hemoglobin: 15.6 g/dL (ref 13.0–17.7)
Immature Grans (Abs): 0 10*3/uL (ref 0.0–0.1)
Immature Granulocytes: 0 %
Lymphocytes Absolute: 1.5 10*3/uL (ref 0.7–3.1)
Lymphs: 17 %
MCH: 31 pg (ref 26.6–33.0)
MCHC: 35.1 g/dL (ref 31.5–35.7)
MCV: 89 fL (ref 79–97)
Monocytes Absolute: 1.2 10*3/uL — ABNORMAL HIGH (ref 0.1–0.9)
Monocytes: 14 %
Neutrophils Absolute: 5.5 10*3/uL (ref 1.4–7.0)
Neutrophils: 62 %
Platelets: 240 10*3/uL (ref 150–450)
RBC: 5.03 x10E6/uL (ref 4.14–5.80)
RDW: 12.6 % (ref 11.6–15.4)
WBC: 8.8 10*3/uL (ref 3.4–10.8)

## 2021-01-20 LAB — LIPID PANEL
Chol/HDL Ratio: 4.7 ratio (ref 0.0–5.0)
Cholesterol, Total: 155 mg/dL (ref 100–199)
HDL: 33 mg/dL — ABNORMAL LOW (ref >39)
LDL Chol Calc (NIH): 97 mg/dL (ref 0–99)
Triglycerides: 142 mg/dL (ref 0–149)
VLDL Cholesterol Cal: 25 mg/dL (ref 5–40)

## 2021-01-20 LAB — TSH: TSH: 1.39 u[IU]/mL (ref 0.450–4.500)

## 2021-01-20 LAB — PSA: Prostate Specific Ag, Serum: 0.9 ng/mL (ref 0.0–4.0)

## 2021-01-20 NOTE — Progress Notes (Signed)
Hi Jesse Diaz. It was good to see you yesterday.  Your lab work looks great.  Keep up the good work.  We will continue to monitor in the future. Please let me know if you have any questions.

## 2021-01-30 ENCOUNTER — Telehealth: Payer: 59 | Admitting: Physician Assistant

## 2021-01-30 DIAGNOSIS — J019 Acute sinusitis, unspecified: Secondary | ICD-10-CM

## 2021-01-30 DIAGNOSIS — R059 Cough, unspecified: Secondary | ICD-10-CM

## 2021-01-30 DIAGNOSIS — B9689 Other specified bacterial agents as the cause of diseases classified elsewhere: Secondary | ICD-10-CM | POA: Diagnosis not present

## 2021-01-30 MED ORDER — PREDNISONE 20 MG PO TABS: 20.0000 mg | ORAL_TABLET | Freq: Every day | ORAL | 0 refills | Status: DC

## 2021-01-30 MED ORDER — AMOXICILLIN-POT CLAVULANATE 875-125 MG PO TABS: 1.0000 | ORAL_TABLET | Freq: Two times a day (BID) | ORAL | 0 refills | Status: DC

## 2021-01-30 MED ORDER — BENZONATATE 100 MG PO CAPS
100.0000 mg | ORAL_CAPSULE | Freq: Three times a day (TID) | ORAL | 0 refills | Status: DC | PRN
Start: 2021-01-30 — End: 2023-01-15

## 2021-01-30 NOTE — Progress Notes (Signed)
Virtual Visit Consent   Jesse Diaz, you are scheduled for a virtual visit with a [facility] provider today.     Just as with appointments in the office, your consent must be obtained to participate.  Your consent will be active for this visit and any virtual visit you may have with one of our providers in the next 365 days.     If you have a MyChart account, a copy of this consent can be sent to you electronically.  All virtual visits are billed to your insurance company just like a traditional visit in the office.    As this is a virtual visit, video technology does not allow for your provider to perform a traditional examination.  This may limit your provider's ability to fully assess your condition.  If your provider identifies any concerns that need to be evaluated in person or the need to arrange testing (such as labs, EKG, etc.), we will make arrangements to do so.     Although advances in technology are sophisticated, we cannot ensure that it will always work on either your end or our end.  If the connection with a video visit is poor, the visit may have to be switched to a telephone visit.  With either a video or telephone visit, we are not always able to ensure that we have a secure connection.     I need to obtain your verbal consent now.   Are you willing to proceed with your visit today?    Jesse Diaz has provided verbal consent on 01/30/2021 for a virtual visit (video or telephone).   Jesse Loveless, PA-C   Date: 01/30/2021 5:14 PM   Virtual Visit via Video Note   I, Jesse Diaz, connected with  Jesse Diaz  ([id number], 1960-10-14) on 01/30/21 at  4:30 PM EDT by a video-enabled telemedicine application and verified that I am speaking with the correct person using two identifiers.  Location: Patient: Virtual Visit Location Patient: Home Provider: Virtual Visit Location Provider: Home Office   I discussed the limitations of evaluation and management by  telemedicine and the availability of in person appointments. The patient expressed understanding and agreed to proceed.    Interactive audio and video communications were attempted, although failed due to patient's inability to connect to video. Continued visit with audio only interaction with patient agreement.  History of Present Illness: Jesse Diaz is a 60 y.o. who identifies as a male who was assigned male at birth, and is being seen today for URI symptoms.  HPI: URI  This is a new problem. The current episode started 1 to 4 weeks ago. The problem has been gradually worsening. There has been no fever. Associated symptoms include congestion, coughing, headaches, a plugged ear sensation, rhinorrhea, sinus pain and a sore throat (from coughing). Pertinent negatives include no diarrhea, ear pain, nausea or vomiting. Associated symptoms comments: Post nasal drainage. Treatments tried: navage netti pot. The treatment provided no relief.     Problems:  Patient Active Problem List   Diagnosis Date Noted   OSA (obstructive sleep apnea) 07/07/2020   Bilateral carpal tunnel syndrome 02/16/2019   Peripheral neuropathy 03/02/2018   Depression with anxiety 11/21/2017   History of gastroesophageal reflux (GERD) 11/21/2017   Benign essential hypertension 03/07/2014    Allergies: No Known Allergies Medications:  Current Outpatient Medications:    amoxicillin-clavulanate (AUGMENTIN) 875-125 MG tablet, Take 1 tablet by mouth 2 (two) times daily., Disp: 20  tablet, Rfl: 0   benzonatate (TESSALON) 100 MG capsule, Take 1 capsule (100 mg total) by mouth 3 (three) times daily as needed., Disp: 30 capsule, Rfl: 0   predniSONE (DELTASONE) 20 MG tablet, Take 1 tablet (20 mg total) by mouth daily with breakfast., Disp: 5 tablet, Rfl: 0   albuterol (VENTOLIN HFA) 108 (90 Base) MCG/ACT inhaler, TAKE 2 PUFFS BY MOUTH EVERY 6 HOURS AS NEEDED FOR WHEEZE OR SHORTNESS OF BREATH, Disp: 18 g, Rfl: 0   Biotin 26378 MCG  TABS, Take 10,000 mg by mouth daily., Disp: , Rfl:    CALCIUM CITRATE PO, Take 750 mg by mouth daily., Disp: , Rfl:    co-enzyme Q-10 30 MG capsule, Take 30 mg by mouth daily., Disp: , Rfl:    DULoxetine (CYMBALTA) 20 MG capsule, TAKE ONE CAPSULE BY MOUTH DAILY FOR 1 TO 2 WEEKS, 2 CAPSULES DAILY FOR 1 TO 2 WEEKS, THEN CONTINUE AT 3 CAPSULES DAILY, Disp: 270 capsule, Rfl: 1   furosemide (LASIX) 20 MG tablet, Take 1 tablet (20 mg total) by mouth daily., Disp: 30 tablet, Rfl: 3   losartan-hydrochlorothiazide (HYZAAR) 50-12.5 MG tablet, Take 1 tablet by mouth daily., Disp: 90 tablet, Rfl: 1   Magnesium 250 MG TABS, Take 250 mg by mouth 2 (two) times daily., Disp: , Rfl:    meloxicam (MOBIC) 7.5 MG tablet, Take 7.5 mg by mouth daily., Disp: , Rfl:    Potassium 99 MG TABS, Take 99 mg by mouth daily., Disp: , Rfl:    rOPINIRole (REQUIP) 0.5 MG tablet, Take 1 tablet (0.5 mg total) by mouth 3 (three) times daily., Disp: , Rfl:    TURMERIC PO, Take by mouth., Disp: , Rfl:    UNABLE TO FIND, Super Oxicell, Disp: , Rfl:   Observations/Objective: Patient is well-developed, well-nourished in no acute distress.  Resting comfortably at home.  Head is normocephalic, atraumatic.  No labored breathing.  Speech is clear and coherent with logical content.  Patient is alert and oriented at baseline.    Assessment and Plan: 1. Acute bacterial sinusitis - amoxicillin-clavulanate (AUGMENTIN) 875-125 MG tablet; Take 1 tablet by mouth 2 (two) times daily.  Dispense: 20 tablet; Refill: 0 - benzonatate (TESSALON) 100 MG capsule; Take 1 capsule (100 mg total) by mouth 3 (three) times daily as needed.  Dispense: 30 capsule; Refill: 0 - predniSONE (DELTASONE) 20 MG tablet; Take 1 tablet (20 mg total) by mouth daily with breakfast.  Dispense: 5 tablet; Refill: 0  2. Cough - benzonatate (TESSALON) 100 MG capsule; Take 1 capsule (100 mg total) by mouth 3 (three) times daily as needed.  Dispense: 30 capsule; Refill: 0 -  predniSONE (DELTASONE) 20 MG tablet; Take 1 tablet (20 mg total) by mouth daily with breakfast.  Dispense: 5 tablet; Refill: 0  - Worsening symptoms that have not responded to OTC medications.  - Will give augmentin, prednisone and tessalon - Continue allergy medications.  - Stay well hydrated and get plenty of rest.  - Call or seek in person evaluation if no symptom improvement or if symptoms worsen.  Follow Up Instructions: I discussed the assessment and treatment plan with the patient. The patient was provided an opportunity to ask questions and all were answered. The patient agreed with the plan and demonstrated an understanding of the instructions.  A copy of instructions were sent to the patient via MyChart unless otherwise noted below.   The patient was advised to call back or seek an in-person evaluation if the  symptoms worsen or if the condition fails to improve as anticipated.  Time:  I spent 18 minutes with the patient via telehealth technology discussing the above problems/concerns.    Jesse Loveless, PA-C

## 2021-01-30 NOTE — Patient Instructions (Signed)
Jesse Aliment Denomme, thank you for joining [name], PA-C for today's virtual visit.  While this provider is not your primary care provider (PCP), if your PCP is located in our provider database this encounter information will be shared with them immediately following your visit.  Consent: (Patient) Jesse Diaz provided verbal consent for this virtual visit at the beginning of the encounter.  Current Medications:  Current Outpatient Medications:    amoxicillin-clavulanate (AUGMENTIN) 875-125 MG tablet, Take 1 tablet by mouth 2 (two) times daily., Disp: 20 tablet, Rfl: 0   benzonatate (TESSALON) 100 MG capsule, Take 1 capsule (100 mg total) by mouth 3 (three) times daily as needed., Disp: 30 capsule, Rfl: 0   predniSONE (DELTASONE) 20 MG tablet, Take 1 tablet (20 mg total) by mouth daily with breakfast., Disp: 5 tablet, Rfl: 0   albuterol (VENTOLIN HFA) 108 (90 Base) MCG/ACT inhaler, TAKE 2 PUFFS BY MOUTH EVERY 6 HOURS AS NEEDED FOR WHEEZE OR SHORTNESS OF BREATH, Disp: 18 g, Rfl: 0   Biotin 11941 MCG TABS, Take 10,000 mg by mouth daily., Disp: , Rfl:    CALCIUM CITRATE PO, Take 750 mg by mouth daily., Disp: , Rfl:    co-enzyme Q-10 30 MG capsule, Take 30 mg by mouth daily., Disp: , Rfl:    DULoxetine (CYMBALTA) 20 MG capsule, TAKE ONE CAPSULE BY MOUTH DAILY FOR 1 TO 2 WEEKS, 2 CAPSULES DAILY FOR 1 TO 2 WEEKS, THEN CONTINUE AT 3 CAPSULES DAILY, Disp: 270 capsule, Rfl: 1   furosemide (LASIX) 20 MG tablet, Take 1 tablet (20 mg total) by mouth daily., Disp: 30 tablet, Rfl: 3   losartan-hydrochlorothiazide (HYZAAR) 50-12.5 MG tablet, Take 1 tablet by mouth daily., Disp: 90 tablet, Rfl: 1   Magnesium 250 MG TABS, Take 250 mg by mouth 2 (two) times daily., Disp: , Rfl:    meloxicam (MOBIC) 7.5 MG tablet, Take 7.5 mg by mouth daily., Disp: , Rfl:    Potassium 99 MG TABS, Take 99 mg by mouth daily., Disp: , Rfl:    rOPINIRole (REQUIP) 0.5 MG tablet, Take 1 tablet (0.5 mg total) by mouth 3 (three)  times daily., Disp: , Rfl:    TURMERIC PO, Take by mouth., Disp: , Rfl:    UNABLE TO FIND, Super Oxicell, Disp: , Rfl:    Medications ordered in this encounter:  Meds ordered this encounter  Medications   amoxicillin-clavulanate (AUGMENTIN) 875-125 MG tablet    Sig: Take 1 tablet by mouth 2 (two) times daily.    Dispense:  20 tablet    Refill:  0    Order Specific Question:   Supervising Provider    Answer:   MILLER, BRIAN [[id number]]   benzonatate (TESSALON) 100 MG capsule    Sig: Take 1 capsule (100 mg total) by mouth 3 (three) times daily as needed.    Dispense:  30 capsule    Refill:  0    Order Specific Question:   Supervising Provider    Answer:   MILLER, BRIAN [[id number]]   predniSONE (DELTASONE) 20 MG tablet    Sig: Take 1 tablet (20 mg total) by mouth daily with breakfast.    Dispense:  5 tablet    Refill:  0    Order Specific Question:   Supervising Provider    Answer:   Hyacinth Meeker, BRIAN [[id number]]     *If you need refills on other medications prior to your next appointment, please contact your pharmacy*  Follow-Up: Call back or seek  an in-person evaluation if the symptoms worsen or if the condition fails to improve as anticipated.  Other Instructions Sinusitis, Adult Sinusitis is soreness and swelling (inflammation) of your sinuses. Sinuses are hollow spaces in the bones around your face. They are located: Around your eyes. In the middle of your forehead. Behind your nose. In your cheekbones. Your sinuses and nasal passages are lined with a fluid called mucus. Mucus drains out of your sinuses. Swelling can trap mucus in your sinuses. This lets germs (bacteria, virus, or fungus) grow, which leads to infection. Most of the time, this condition is caused by a virus. What are the causes? This condition is caused by: Allergies. Asthma. Germs. Things that block your nose or sinuses. Growths in the nose (nasal polyps). Chemicals or irritants in the air. Fungus (rare). What  increases the risk? You are more likely to develop this condition if: You have a weak body defense system (immune system). You do a lot of swimming or diving. You use nasal sprays too much. You smoke. What are the signs or symptoms? The main symptoms of this condition are pain and a feeling of pressure around the sinuses. Other symptoms include: Stuffy nose (congestion). Runny nose (drainage). Swelling and warmth in the sinuses. Headache. Toothache. A cough that may get worse at night. Mucus that collects in the throat or the back of the nose (postnasal drip). Being unable to smell and taste. Being very tired (fatigue). A fever. Sore throat. Bad breath. How is this diagnosed? This condition is diagnosed based on: Your symptoms. Your medical history. A physical exam. Tests to find out if your condition is short-term (acute) or long-term (chronic). Your doctor may: Check your nose for growths (polyps). Check your sinuses using a tool that has a light (endoscope). Check for allergies or germs. Do imaging tests, such as an MRI or CT scan. How is this treated? Treatment for this condition depends on the cause and whether it is short-term or long-term. If caused by a virus, your symptoms should go away on their own within 10 days. You may be given medicines to relieve symptoms. They include: Medicines that shrink swollen tissue in the nose. Medicines that treat allergies (antihistamines). A spray that treats swelling of the nostrils.  Rinses that help get rid of thick mucus in your nose (nasal saline washes). If caused by bacteria, your doctor may wait to see if you will get better without treatment. You may be given antibiotic medicine if you have: A very bad infection. A weak body defense system. If caused by growths in the nose, you may need to have surgery. Follow these instructions at home: Medicines Take, use, or apply over-the-counter and prescription medicines only as  told by your doctor. These may include nasal sprays. If you were prescribed an antibiotic medicine, take it as told by your doctor. Do not stop taking the antibiotic even if you start to feel better. Hydrate and humidify  Drink enough water to keep your pee (urine) pale yellow. Use a cool mist humidifier to keep the humidity level in your home above 50%. Breathe in steam for 10-15 minutes, 3-4 times a day, or as told by your doctor. You can do this in the bathroom while a hot shower is running. Try not to spend time in cool or dry air. Rest Rest as much as you can. Sleep with your head raised (elevated). Make sure you get enough sleep each night. General instructions  Put a warm, moist washcloth  on your face 3-4 times a day, or as often as told by your doctor. This will help with discomfort. Wash your hands often with soap and water. If there is no soap and water, use hand sanitizer. Do not smoke. Avoid being around people who are smoking (secondhand smoke). Keep all follow-up visits as told by your doctor. This is important. Contact a doctor if: You have a fever. Your symptoms get worse. Your symptoms do not get better within 10 days. Get help right away if: You have a very bad headache. You cannot stop throwing up (vomiting). You have very bad pain or swelling around your face or eyes. You have trouble seeing. You feel confused. Your neck is stiff. You have trouble breathing. Summary Sinusitis is swelling of your sinuses. Sinuses are hollow spaces in the bones around your face. This condition is caused by tissues in your nose that become inflamed or swollen. This traps germs. These can lead to infection. If you were prescribed an antibiotic medicine, take it as told by your doctor. Do not stop taking it even if you start to feel better. Keep all follow-up visits as told by your doctor. This is important. This information is not intended to replace advice given to you by your  health care provider. Make sure you discuss any questions you have with your health care provider. Document Revised: 09/19/2017 Document Reviewed: 09/19/2017 Elsevier Patient Education  2022 [employer].    If you have been instructed to have an in-person evaluation today at a local Urgent Care facility, please use the link below. It will take you to a list of all of our available [facility], including address, phone number and hours of operation. Please do not delay care.  [facility]  If you or a family member do not have a primary care provider, use the link below to schedule a visit and establish care. When you choose a [facility] primary care physician or advanced practice provider, you gain a long-term partner in health. Find a Primary Care Provider  Learn more about [facility]'s in-office and virtual care options: [facility] - Get Care Now

## 2021-07-20 ENCOUNTER — Ambulatory Visit: Payer: 59 | Admitting: Nurse Practitioner

## 2021-08-25 ENCOUNTER — Other Ambulatory Visit: Payer: Self-pay | Admitting: Nurse Practitioner

## 2021-08-27 NOTE — Telephone Encounter (Signed)
Requested medications are due for refill today.  yes ? ?Requested medications are on the active medications list.  yes ? ?Last refill. 01/19/2021 390 1 refill ? ?Future visit scheduled.   no ? ?Notes to clinic.  Labs are expired - pt needs appointment. ? ? ? ?Requested Prescriptions  ?Pending Prescriptions Disp Refills  ? losartan-hydrochlorothiazide (HYZAAR) 50-12.5 MG tablet [Pharmacy Med Name: LOSARTAN/HCT TAB 50-12.5] 90 tablet 1  ?  Sig: TAKE 1 TABLET DAILY  ?  ? Cardiovascular: ARB + Diuretic Combos Failed - 08/25/2021  2:24 PM  ?  ?  Failed - K in normal range and within 180 days  ?  Potassium  ?Date Value Ref Range Status  ?01/19/2021 4.1 3.5 - 5.2 mmol/L Final  ?  ?  ?  ?  Failed - Na in normal range and within 180 days  ?  Sodium  ?Date Value Ref Range Status  ?01/19/2021 140 134 - 144 mmol/L Final  ?  ?  ?  ?  Failed - Cr in normal range and within 180 days  ?  Creatinine, Ser  ?Date Value Ref Range Status  ?01/19/2021 0.88 0.76 - 1.27 mg/dL Final  ?  ?  ?  ?  Failed - eGFR is 10 or above and within 180 days  ?  GFR calc Af Amer  ?Date Value Ref Range Status  ?06/09/2020 110 >59 mL/min/1.73 Final  ?  Comment:  ?  **In accordance with recommendations from the NKF-ASN Task force,** ?  [facility] is in the process of updating its eGFR calculation to the ?  2021 CKD-EPI creatinine equation that estimates kidney function ?  without a race variable. ?  ? ?GFR calc non Af Amer  ?Date Value Ref Range Status  ?06/09/2020 95 >59 mL/min/1.73 Final  ? ?eGFR  ?Date Value Ref Range Status  ?01/19/2021 99 >59 mL/min/1.73 Final  ?  ?  ?  ?  Failed - Valid encounter within last 6 months  ?  Recent Outpatient Visits   ? ?      ? 7 months ago Annual physical exam  ? [facility] Holdsworth, Karen, NP  ? 10 months ago Peripheral polyneuropathy  ? [facility] Johnson, Megan P, DO  ? 1 year ago Peripheral polyneuropathy  ? [facility] Holdsworth, Karen, NP  ? 1 year ago Benign essential  hypertension  ? [facility] Johnson, Megan P, DO  ? 1 year ago Benign essential hypertension  ? [facility] Lane, Rachel Elizabeth, PA-C  ? ?  ?  ? ? ?  ?  ?  Passed - Patient is not pregnant  ?  ?  Passed - Last BP in normal range  ?  BP Readings from Last 1 Encounters:  ?01/19/21 137/78  ?  ?  ?  ?  ?  ?

## 2022-10-29 IMAGING — DX DG CHEST 2V
3 series · 3 of 3 positions shown · non-contrast
Comparison: Radiographs 05/10/2015.  No recent prior studies.

CLINICAL DATA: Shortness of breath since COVID infection 2 years
ago.

EXAM:
CHEST - 2 VIEW

[chest pa]
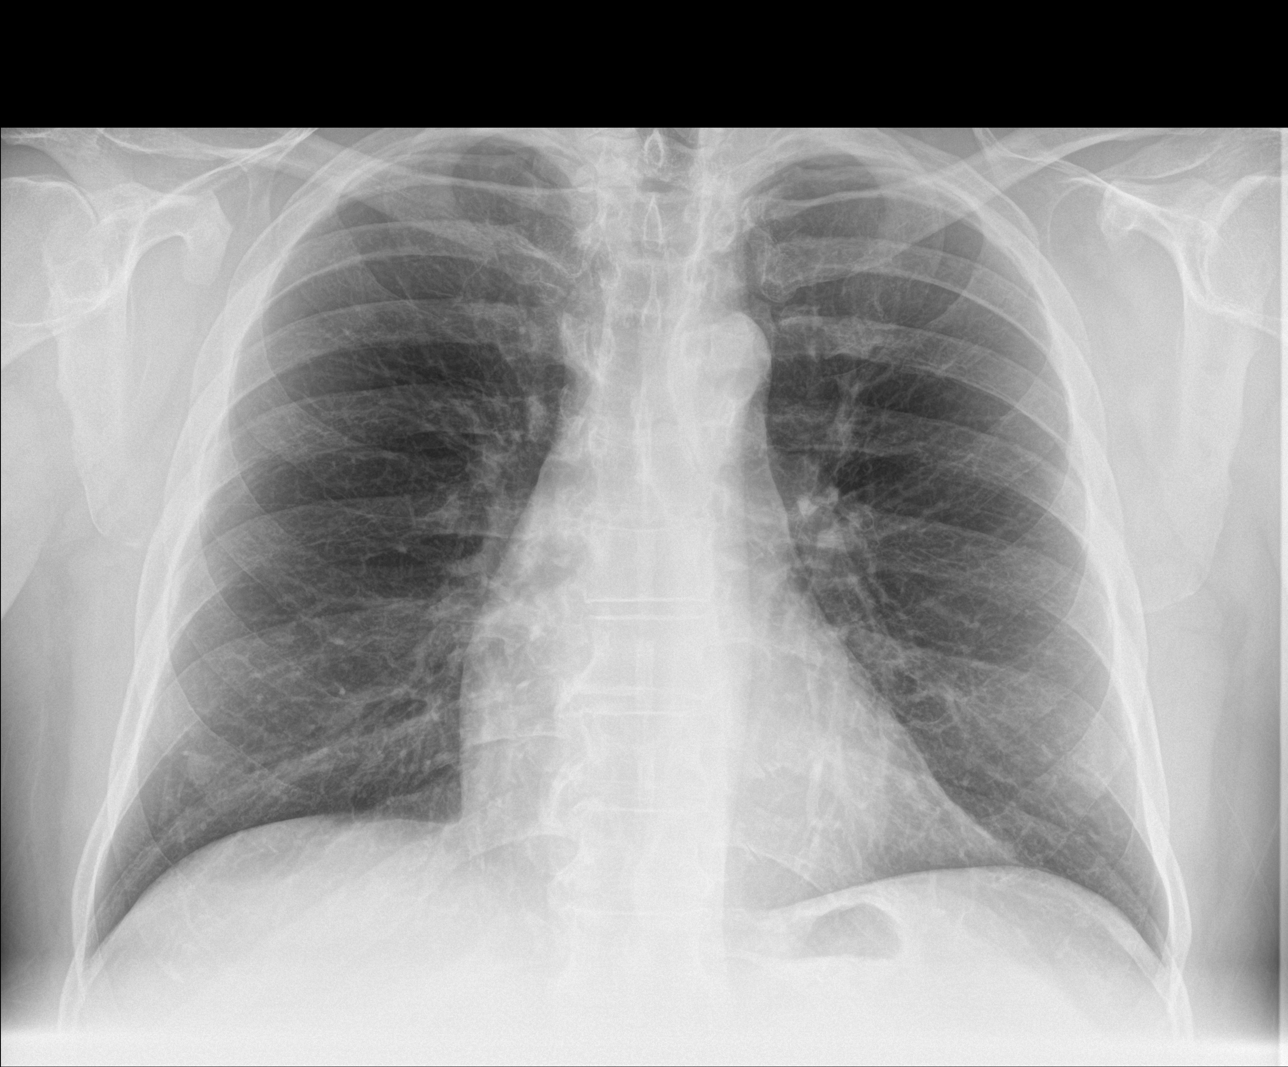

[chest lat (1 of 2)]
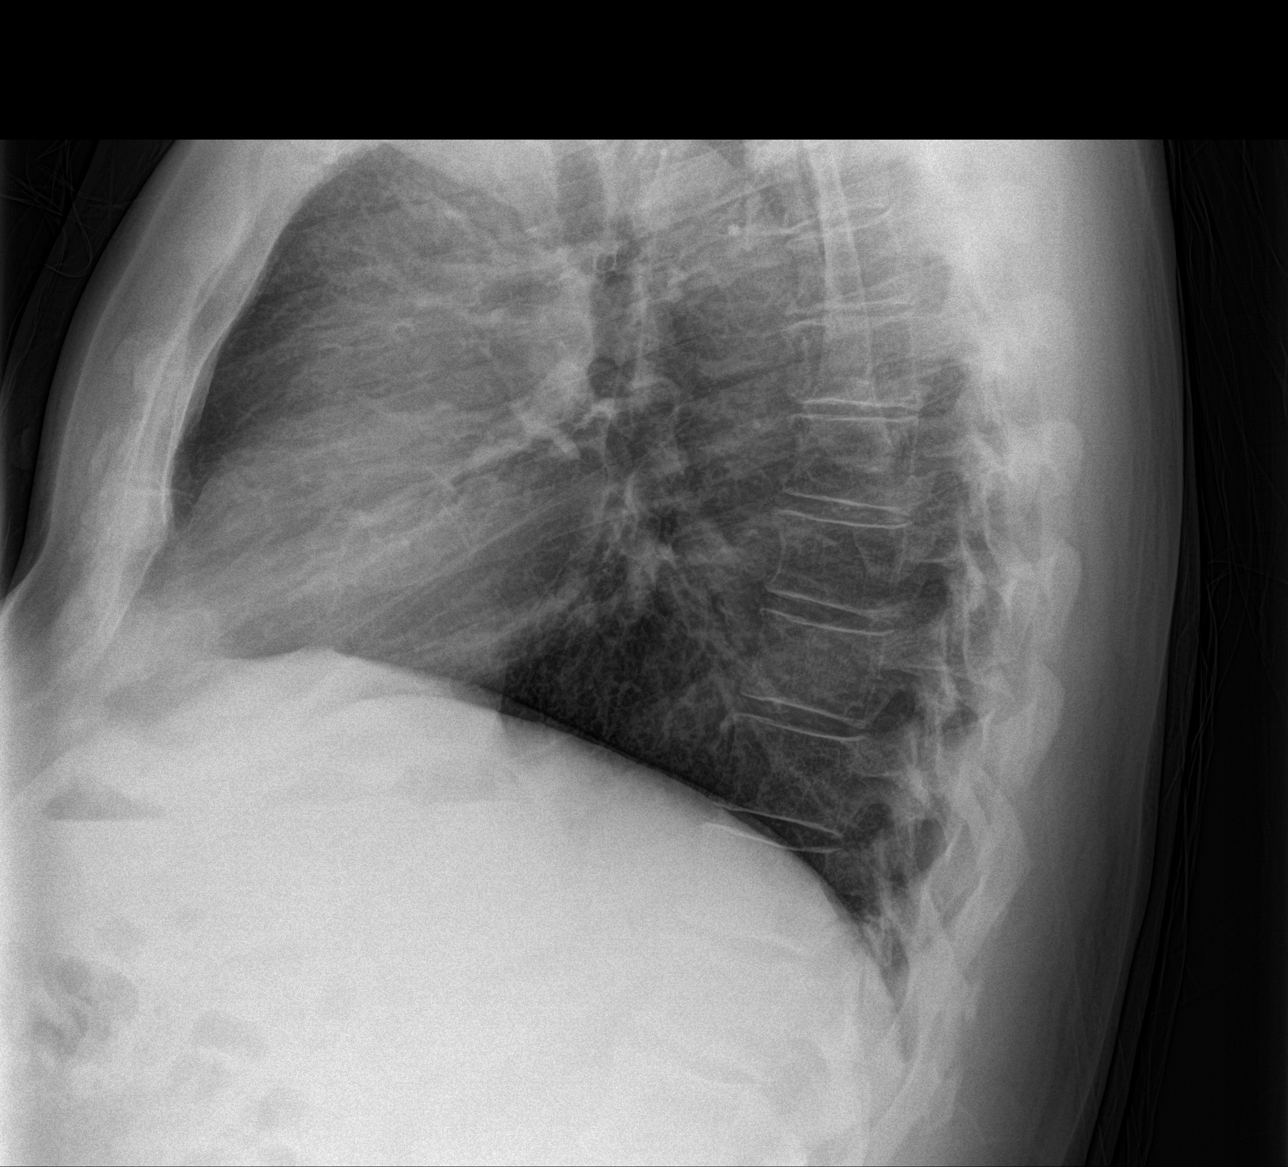

[chest lat (2 of 2)]
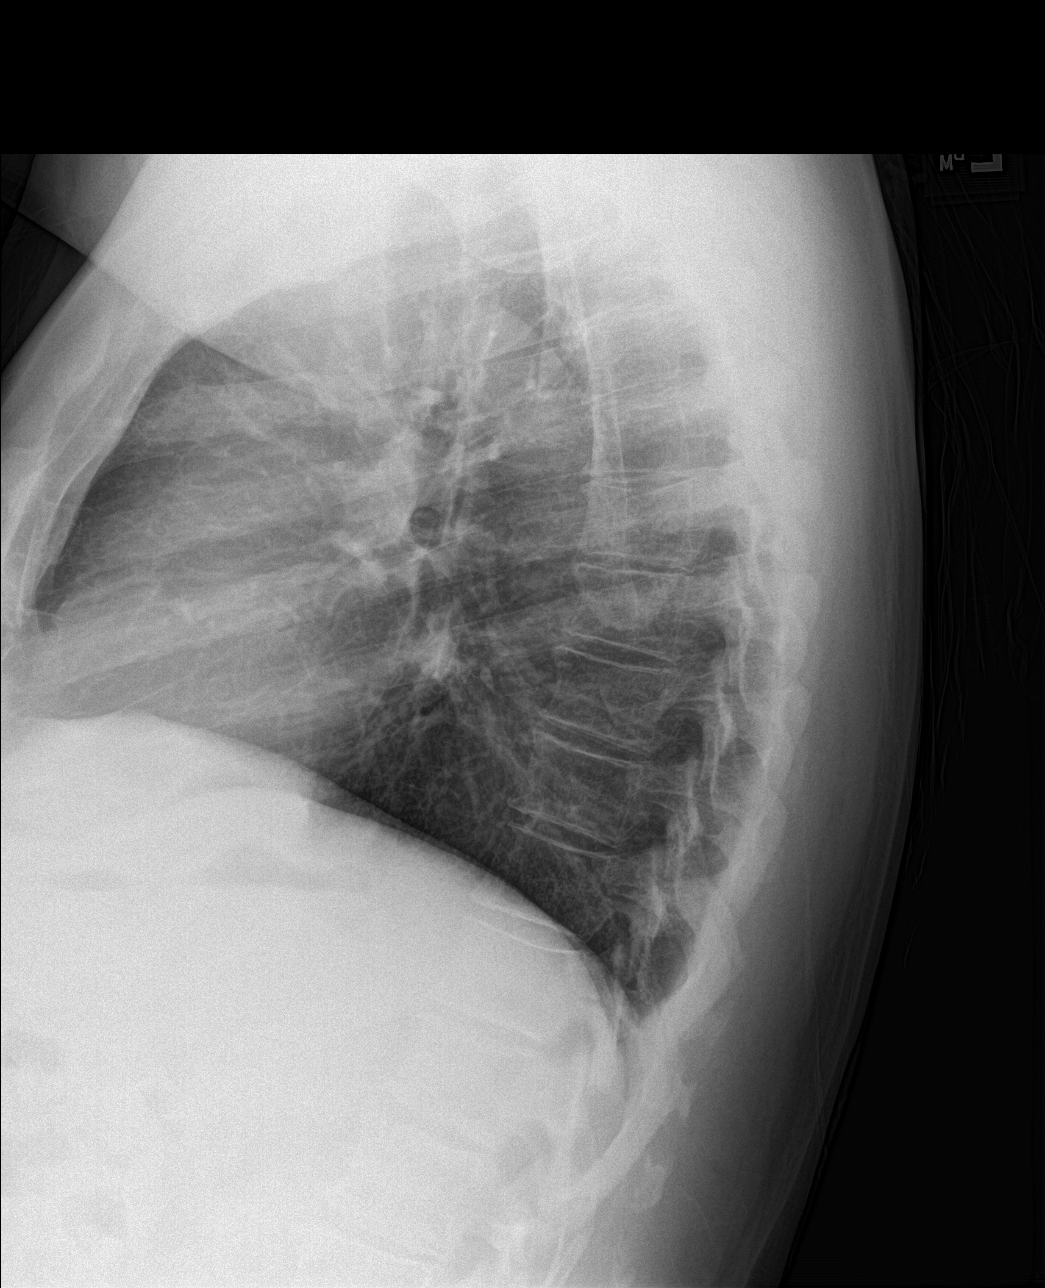

[3 of 3 positions shown; findings below may reference images not displayed]

FINDINGS: The lateral view was repeated. The heart size and mediastinal
contours are stable. There is possible mild central airway
thickening, but no airspace disease, edema, pleural effusion or
pneumothorax. A small nodular density projecting over the anterior
aspect of the right 6th rib on the frontal examination may reflect
an asymmetric nipple shadow. No evidence of pulmonary nodule on the
lateral view. The bones appear unremarkable.
IMPRESSION: 1. No acute findings or explanation for the patient's symptoms.
Possible mild chronic central airway thickening.
2. Possible nipple shadow projecting over the right lung base on the
frontal examination. Recommend repeat PA view with nipple markers to
exclude pulmonary nodule.

## 2022-11-05 IMAGING — DX DG CHEST 1V
1 series · 1 of 1 positions shown · non-contrast
Comparison: 06/09/2020

CLINICAL DATA: Repeat with nipple markers to exclude pulmonary
nodule

EXAM:
CHEST  1 VIEW

[chest pa]
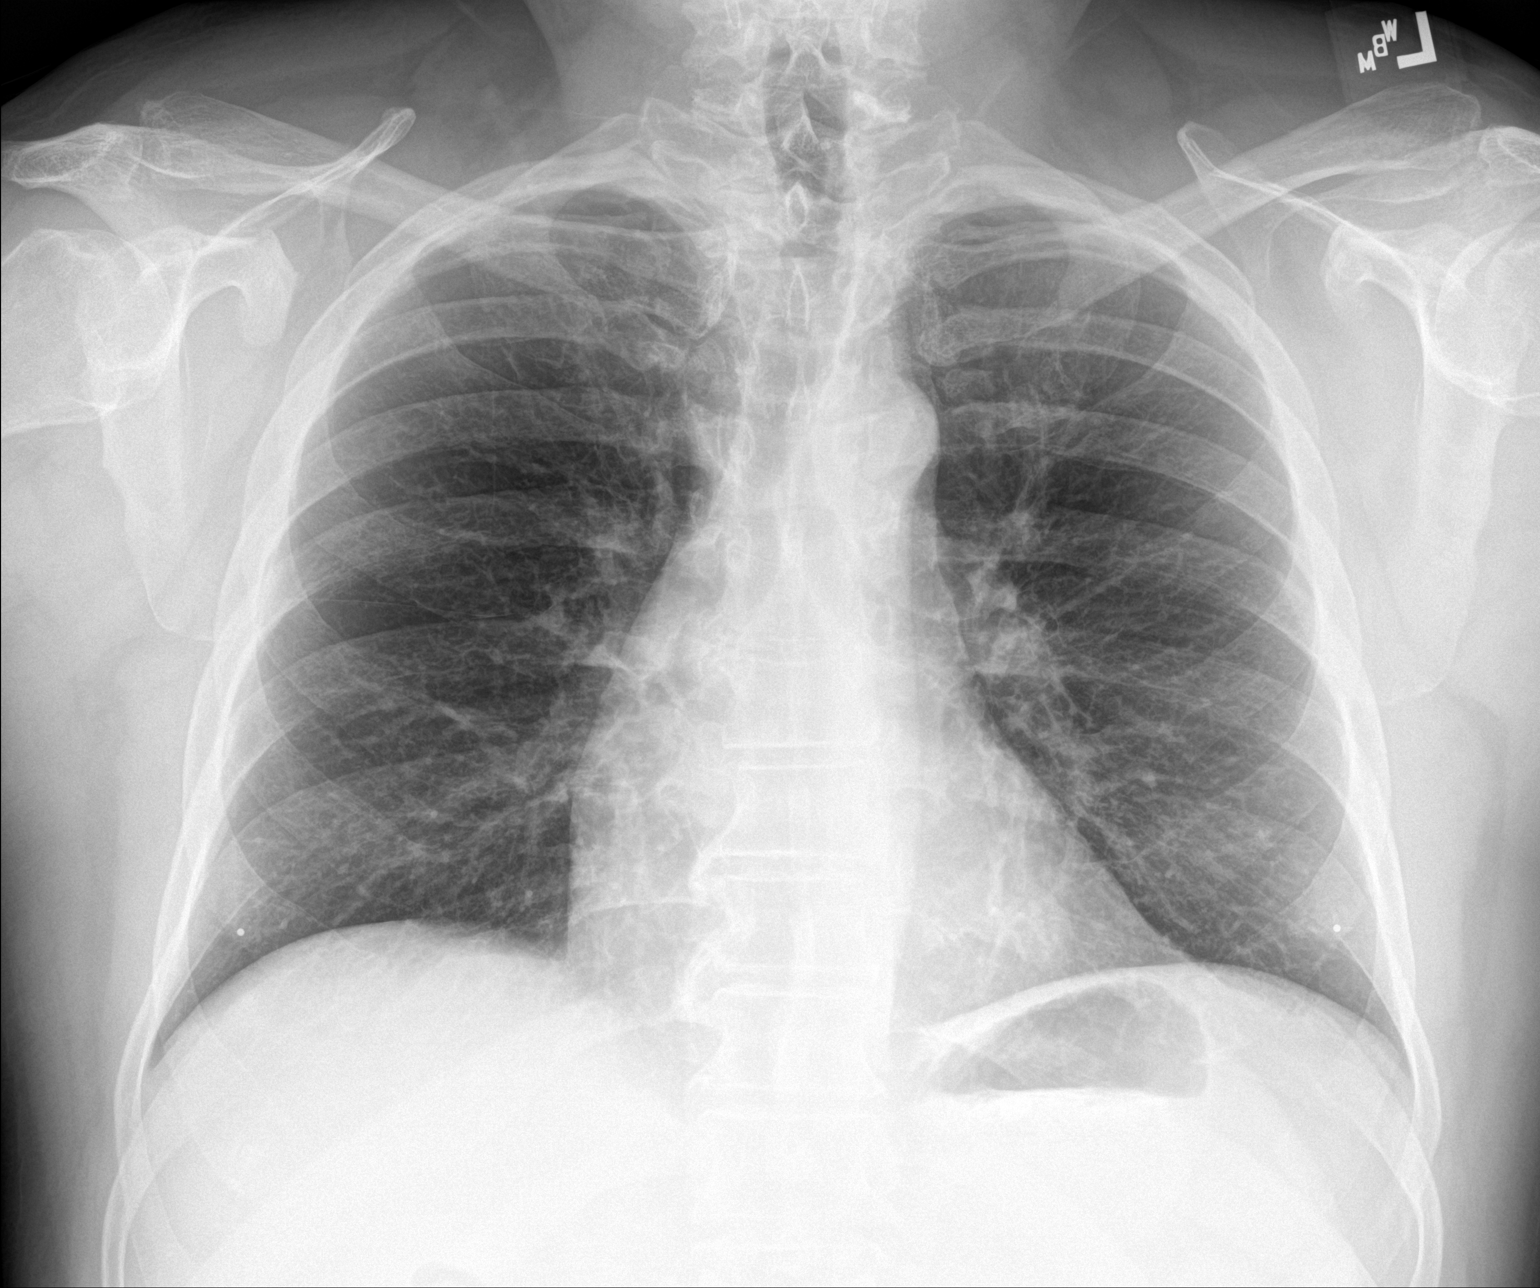

[1 of 1 positions shown; findings below may reference images not displayed]

FINDINGS: Lungs are clear.  No pleural effusion or pneumothorax.

Nodular opacity at the right lung base corresponds to the patient's
nipple shadow.

The heart is normal in size.
IMPRESSION: No evidence of acute cardiopulmonary disease.

## 2023-01-11 ENCOUNTER — Emergency Department
Admission: EM | Admit: 2023-01-11 | Discharge: 2023-01-11 | Disposition: A | Discharge status: Home / Self Care | Payer: 59 | Attending: Emergency Medicine | Admitting: Emergency Medicine

## 2023-01-11 ENCOUNTER — Encounter: Payer: Self-pay | Admitting: Emergency Medicine

## 2023-01-11 ENCOUNTER — Other Ambulatory Visit: Payer: Self-pay

## 2023-01-11 ENCOUNTER — Emergency Department: Payer: 59

## 2023-01-11 DIAGNOSIS — M25561 Pain in right knee: Secondary | ICD-10-CM | POA: Diagnosis present

## 2023-01-11 DIAGNOSIS — L03115 Cellulitis of right lower limb: Secondary | ICD-10-CM | POA: Insufficient documentation

## 2023-01-11 LAB — CBC WITH DIFFERENTIAL/PLATELET
Abs Immature Granulocytes: 0.17 10*3/uL — ABNORMAL HIGH (ref 0.00–0.07)
Basophils Absolute: 0 10*3/uL (ref 0.0–0.1)
Basophils Relative: 0 %
Eosinophils Absolute: 0 10*3/uL (ref 0.0–0.5)
Eosinophils Relative: 0 %
HCT: 40.9 % (ref 39.0–52.0)
Hemoglobin: 14.2 g/dL (ref 13.0–17.0)
Immature Granulocytes: 1 %
Lymphocytes Relative: 9 %
Lymphs Abs: 1.4 10*3/uL (ref 0.7–4.0)
MCH: 31 pg (ref 26.0–34.0)
MCHC: 34.7 g/dL (ref 30.0–36.0)
MCV: 89.3 fL (ref 80.0–100.0)
Monocytes Absolute: 1.3 10*3/uL — ABNORMAL HIGH (ref 0.1–1.0)
Monocytes Relative: 8 %
Neutro Abs: 12.7 10*3/uL — ABNORMAL HIGH (ref 1.7–7.7)
Neutrophils Relative %: 82 %
Platelets: 307 10*3/uL (ref 150–400)
RBC: 4.58 MIL/uL (ref 4.22–5.81)
RDW: 13.3 % (ref 11.5–15.5)
WBC: 15.6 10*3/uL — ABNORMAL HIGH (ref 4.0–10.5)
nRBC: 0 % (ref 0.0–0.2)

## 2023-01-11 LAB — SYNOVIAL CELL COUNT + DIFF, W/ CRYSTALS
Crystals, Fluid: NONE SEEN
Eosinophils-Synovial: 0 %
Lymphocytes-Synovial Fld: 39 %
Monocyte-Macrophage-Synovial Fluid: 53 %
Neutrophil, Synovial: 8 %
WBC, Synovial: 36 /mm3 (ref 0–200)

## 2023-01-11 LAB — COMPREHENSIVE METABOLIC PANEL
ALT: 31 U/L (ref 0–44)
AST: 19 U/L (ref 15–41)
Albumin: 3.4 g/dL — ABNORMAL LOW (ref 3.5–5.0)
Alkaline Phosphatase: 54 U/L (ref 38–126)
Anion gap: 12 (ref 5–15)
BUN: 26 mg/dL — ABNORMAL HIGH (ref 8–23)
CO2: 23 mmol/L (ref 22–32)
Calcium: 8.8 mg/dL — ABNORMAL LOW (ref 8.9–10.3)
Chloride: 104 mmol/L (ref 98–111)
Creatinine, Ser: 0.74 mg/dL (ref 0.61–1.24)
GFR, Estimated: >60 mL/min (ref >60)
Glucose, Bld: 118 mg/dL — ABNORMAL HIGH (ref 70–99)
Potassium: 3.9 mmol/L (ref 3.5–5.1)
Sodium: 139 mmol/L (ref 135–145)
Total Bilirubin: 0.6 mg/dL (ref 0.3–1.2)
Total Protein: 6.8 g/dL (ref 6.5–8.1)

## 2023-01-11 LAB — URIC ACID: Uric Acid, Serum: 4.1 mg/dL (ref 3.7–8.6)

## 2023-01-11 LAB — C-REACTIVE PROTEIN: CRP: 1.9 mg/dL — ABNORMAL HIGH (ref <1.0)

## 2023-01-11 LAB — SEDIMENTATION RATE: Sed Rate: 20 mm/h (ref 0–20)

## 2023-01-11 MED ORDER — SULFAMETHOXAZOLE-TRIMETHOPRIM 800-160 MG PO TABS
1.0000 | ORAL_TABLET | Freq: Once | ORAL | Status: AC
  Administered 2023-01-11: 1 via ORAL
  Filled 2023-01-11: qty 1

## 2023-01-11 MED ORDER — HYDROCODONE-ACETAMINOPHEN 5-325 MG PO TABS: 1.0000 | ORAL_TABLET | Freq: Four times a day (QID) | ORAL | 0 refills | Status: AC | PRN

## 2023-01-11 MED ORDER — SULFAMETHOXAZOLE-TRIMETHOPRIM 800-160 MG PO TABS: 1.0000 | ORAL_TABLET | Freq: Two times a day (BID) | ORAL | 0 refills | Status: DC

## 2023-01-11 MED ORDER — OXYCODONE-ACETAMINOPHEN 5-325 MG PO TABS
1.0000 | ORAL_TABLET | Freq: Once | ORAL | Status: AC
  Administered 2023-01-11: 1 via ORAL
  Filled 2023-01-11: qty 1

## 2023-01-11 MED ORDER — LIDOCAINE HCL (PF) 1 % IJ SOLN
5.0000 mL | Freq: Once | INTRAMUSCULAR | Status: AC
  Administered 2023-01-11: 5 mL
  Filled 2023-01-11: qty 5

## 2023-01-11 NOTE — Discharge Instructions (Signed)
Please rest ice and elevate the knee.  Take antibiotics as prescribed.  Use pain medication as needed for severe pain.  Call orthopedic office tomorrow to schedule follow-up appointment for recheck.  Return to the ER for any worsening symptoms such as fevers increasing pain or any urgent changes in your health.

## 2023-01-11 NOTE — ED Triage Notes (Signed)
Pt sts that he was over at [facility] urgent care for pain in his right knee. Pt had a follow up today and was told to come to the ED. Pt sts that they dx him as gout however they gave him prednisone and hydrocodone to treat it. Pt advised that his knee got better but now it is worse.

## 2023-01-11 NOTE — ED Notes (Signed)
See triage note  Presents with pain and some swelling to right knee  States he was dx;d with gout recently  Placed on steroids and pain meds  States it was getting better until recently    denies any recent injury

## 2023-01-11 NOTE — ED Provider Notes (Signed)
Shared visit   Right knee pain and swelling.  Overlying signs of warmth.  Concern for possible septic joint.  Clinical picture is not consistent with septic joint after joint aspiration.  Started on antibiotics.  No crystals.  Close follow-up with orthopedics.   Corena Herter, MD 01/12/23 0006

## 2023-01-11 NOTE — ED Provider Notes (Signed)
[facility] DEPARTMENT AT [facility] Provider Note   CSN: [id number] Arrival date & time: 01/11/23  1801     History  Chief Complaint  Patient presents with   Knee Pain    Jesse Diaz is a 62 y.o. male.  Presents to the emergency department for evaluation of right knee pain, swelling.  Patient had a recent diagnosis of gout, was placed on prednisone and pain medications.  Initially he was getting better until recently.  Today he went to the walk-in clinic and had elevated white count of 18,000.  He was sent for workup for possible septic joint.  Patient's redness started 01/05/2023.  Was treated for possible gout on 01/08/2023.  Initially getting better but today getting worse.  No recent knee surgery or knee replacement.  No drainage.  No trauma or injury.  Pain is moderate  HPI     Home Medications Prior to Admission medications   Medication Sig Start Date End Date Taking? Authorizing Provider  HYDROcodone-acetaminophen (NORCO) 5-325 MG tablet Take 1 tablet by mouth every 6 (six) hours as needed for moderate pain. 01/11/23  Yes Evon Slack, PA-C  sulfamethoxazole-trimethoprim (BACTRIM DS) 800-160 MG tablet Take 1 tablet by mouth 2 (two) times daily for 10 days. 01/11/23 01/21/23 Yes Evon Slack, PA-C  albuterol (VENTOLIN HFA) 108 (90 Base) MCG/ACT inhaler TAKE 2 PUFFS BY MOUTH EVERY 6 HOURS AS NEEDED FOR WHEEZE OR SHORTNESS OF BREATH 12/31/18   Particia Nearing, PA-C  amoxicillin-clavulanate (AUGMENTIN) 875-125 MG tablet Take 1 tablet by mouth 2 (two) times daily. 01/30/21   [name], PA-C  benzonatate (TESSALON) 100 MG capsule Take 1 capsule (100 mg total) by mouth 3 (three) times daily as needed. 01/30/21   [name], PA-C  Biotin 24401 MCG TABS Take 10,000 mg by mouth daily.    [provider]  CALCIUM CITRATE PO Take 750 mg by mouth daily.    [provider]  co-enzyme Q-10 30 MG capsule Take 30 mg by mouth daily.     [provider]  DULoxetine (CYMBALTA) 20 MG capsule TAKE ONE CAPSULE BY MOUTH DAILY FOR 1 TO 2 WEEKS, 2 CAPSULES DAILY FOR 1 TO 2 WEEKS, THEN CONTINUE AT 3 CAPSULES DAILY 01/19/21   Larae Grooms, NP  furosemide (LASIX) 20 MG tablet Take 1 tablet (20 mg total) by mouth daily. 06/09/20   Johnson, Megan P, DO  losartan-hydrochlorothiazide (HYZAAR) 50-12.5 MG tablet Take 1 tablet by mouth daily. 01/19/21   Larae Grooms, NP  Magnesium 250 MG TABS Take 250 mg by mouth 2 (two) times daily.    [provider]  meloxicam (MOBIC) 7.5 MG tablet Take 7.5 mg by mouth daily. 04/19/20   [provider]  Potassium 99 MG TABS Take 99 mg by mouth daily.    [provider]  predniSONE (DELTASONE) 20 MG tablet Take 1 tablet (20 mg total) by mouth daily with breakfast. 01/30/21   [name], PA-C  rOPINIRole (REQUIP) 0.5 MG tablet Take 1 tablet (0.5 mg total) by mouth 3 (three) times daily. 06/09/20   Johnson, Megan P, DO  TURMERIC PO Take by mouth.    [provider]  UNABLE TO FIND Super Oxicell    [provider]      Allergies    Patient has no known allergies.    Review of Systems   Review of Systems  Physical Exam Updated Vital Signs BP (!) 170/110 (BP Location: Right  Arm)   Pulse 65   Temp 98.6 F (37 C) (Oral)   Resp 17   Ht 5\' 8"  (1.727 m)   Wt 95.3 kg   SpO2 95%   BMI 31.93 kg/m  Physical Exam Constitutional:      Appearance: He is well-developed.  HENT:     Head: Normocephalic and atraumatic.  Eyes:     Conjunctiva/sclera: Conjunctivae normal.  Cardiovascular:     Rate and Rhythm: Normal rate.  Pulmonary:     Effort: Pulmonary effort is normal. No respiratory distress.  Musculoskeletal:        General: Normal range of motion.     Cervical back: Normal range of motion.     Comments: Right knee with anterior erythema along the inferior portion of the patella and prepatellar bursa region.  There is no fluctuance.   No drainage.  Slight knee effusion.  He is able to maintain active knee extension.  Knee stable to valgus and varus stress testing.  Normal hip internal ex rotation with no discomfort.  He is neuro vas intact right lower extremity with 2+ dorsalis pedis pulses.  Compartments are soft in the lower extremity.  Skin:    General: Skin is warm.     Findings: No rash.  Neurological:     Mental Status: He is alert and oriented to person, place, and time.  Psychiatric:        Behavior: Behavior normal.        Thought Content: Thought content normal.     ED Results / Procedures / Treatments   Labs (all labs ordered are listed, but only abnormal results are displayed) Labs Reviewed  CBC WITH DIFFERENTIAL/PLATELET - Abnormal; Notable for the following components:      Result Value   WBC 15.6 (*)    Neutro Abs 12.7 (*)    Monocytes Absolute 1.3 (*)    Abs Immature Granulocytes 0.17 (*)    All other components within normal limits  COMPREHENSIVE METABOLIC PANEL - Abnormal; Notable for the following components:   Glucose, Bld 118 (*)    BUN 26 (*)    Calcium 8.8 (*)    Albumin 3.4 (*)    All other components within normal limits  SYNOVIAL CELL COUNT + DIFF, W/ CRYSTALS - Abnormal; Notable for the following components:   Color, Synovial YELLOW (*)    Appearance-Synovial CLEAR (*)    All other components within normal limits  SEDIMENTATION RATE  URIC ACID  C-REACTIVE PROTEIN    EKG None  Radiology DG Knee Complete 4 Views Right  Result Date: 01/11/2023 CLINICAL DATA:  Right knee pain. EXAM: RIGHT KNEE - COMPLETE 4+ VIEW COMPARISON:  None Available. FINDINGS: Mild medial compartmental articular space narrowing. Mild marginal spurring of the patella. No knee effusion. Substantial subcutaneous edema anterior to the patella patellar tendon. IMPRESSION: 1. Substantial subcutaneous edema anterior to the patella and patellar tendon. 2. Mild medial compartmental articular space narrowing.  Electronically Signed   By: Gaylyn Rong M.D.   On: 01/11/2023 19:42    Procedures .Joint Aspiration/Arthrocentesis  Date/Time: 01/11/2023 8:04 PM  Performed by: Evon Slack, PA-C Authorized by: Evon Slack, PA-C   Consent:    Consent obtained:  Verbal   Consent given by:  Patient   Risks discussed:  Bleeding Location:    Location:  Knee   Knee:  R knee Anesthesia:    Anesthesia method:  Local infiltration   Local anesthetic:  Lidocaine 1% w/o epi  Procedure details:    Needle gauge:  18 G   Ultrasound guidance: yes     Approach:  Lateral   Aspirate amount:  15   Aspirate characteristics:  Clear and yellow   Steroid injected: no     Specimen collected: yes   Post-procedure details:    Dressing:  Adhesive bandage   Procedure completion:  Tolerated     Medications Ordered in ED Medications  oxyCODONE-acetaminophen (PERCOCET/ROXICET) 5-325 MG per tablet 1 tablet (1 tablet Oral Given 01/11/23 1900)  lidocaine (PF) (XYLOCAINE) 1 % injection 5 mL (5 mLs Infiltration Given by Other 01/11/23 1927)  sulfamethoxazole-trimethoprim (BACTRIM DS) 800-160 MG per tablet 1 tablet (1 tablet Oral Given 01/11/23 2032)    ED Course/ Medical Decision Making/ A&P                                 Medical Decision Making Amount and/or Complexity of Data Reviewed Labs: ordered. Radiology: ordered.  Risk Prescription drug management.   62 year old male with right knee pain, redness.  Afebrile.  Symptoms have been increasing over the last few days.  Ultrasound used to evaluate knee, slight fluid on the joint, this was aspirated and patient had no evidence of crystals.  White blood cell count 36, no signs of septic joint.  X-ray showed no evidence of acute bony abnormality of the right knee, minimal arthritic changes in the medial compartment.  Patient's history and exam consistent with cellulitis.  Will place on Bactrim DS.  Patient will follow-up with orthopedics for recheck.  He  understands signs symptoms return to the ER for. Final Clinical Impression(s) / ED Diagnoses Final diagnoses:  Cellulitis of right knee    Rx / DC Orders ED Discharge Orders          Ordered    sulfamethoxazole-trimethoprim (BACTRIM DS) 800-160 MG tablet  2 times daily        01/11/23 2149    HYDROcodone-acetaminophen (NORCO) 5-325 MG tablet  Every 6 hours PRN        01/11/23 2149              Evon Slack, PA-C 01/11/23 2151    Corena Herter, MD 01/12/23 0006

## 2023-01-15 ENCOUNTER — Inpatient Hospital Stay
Admission: EM | Admit: 2023-01-15 | Discharge: 2023-01-18 | DRG: 603 | Disposition: A | Discharge status: Home / Self Care | Payer: 59 | Attending: Student in an Organized Health Care Education/Training Program | Admitting: Student in an Organized Health Care Education/Training Program

## 2023-01-15 DIAGNOSIS — I10 Essential (primary) hypertension: Secondary | ICD-10-CM | POA: Diagnosis present

## 2023-01-15 DIAGNOSIS — F419 Anxiety disorder, unspecified: Secondary | ICD-10-CM | POA: Diagnosis present

## 2023-01-15 DIAGNOSIS — L089 Local infection of the skin and subcutaneous tissue, unspecified: Secondary | ICD-10-CM | POA: Diagnosis not present

## 2023-01-15 DIAGNOSIS — Z8249 Family history of ischemic heart disease and other diseases of the circulatory system: Secondary | ICD-10-CM | POA: Diagnosis not present

## 2023-01-15 DIAGNOSIS — Z7952 Long term (current) use of systemic steroids: Secondary | ICD-10-CM

## 2023-01-15 DIAGNOSIS — E871 Hypo-osmolality and hyponatremia: Secondary | ICD-10-CM

## 2023-01-15 DIAGNOSIS — Z823 Family history of stroke: Secondary | ICD-10-CM | POA: Diagnosis not present

## 2023-01-15 DIAGNOSIS — M109 Gout, unspecified: Secondary | ICD-10-CM | POA: Diagnosis present

## 2023-01-15 DIAGNOSIS — G4733 Obstructive sleep apnea (adult) (pediatric): Secondary | ICD-10-CM | POA: Diagnosis present

## 2023-01-15 DIAGNOSIS — Z79899 Other long term (current) drug therapy: Secondary | ICD-10-CM | POA: Diagnosis not present

## 2023-01-15 DIAGNOSIS — L03115 Cellulitis of right lower limb: Secondary | ICD-10-CM | POA: Diagnosis present

## 2023-01-15 DIAGNOSIS — F32A Depression, unspecified: Secondary | ICD-10-CM | POA: Diagnosis present

## 2023-01-15 DIAGNOSIS — Z791 Long term (current) use of non-steroidal anti-inflammatories (NSAID): Secondary | ICD-10-CM | POA: Diagnosis not present

## 2023-01-15 DIAGNOSIS — Z87891 Personal history of nicotine dependence: Secondary | ICD-10-CM | POA: Diagnosis not present

## 2023-01-15 DIAGNOSIS — F418 Other specified anxiety disorders: Secondary | ICD-10-CM | POA: Diagnosis present

## 2023-01-15 DIAGNOSIS — Z833 Family history of diabetes mellitus: Secondary | ICD-10-CM | POA: Diagnosis not present

## 2023-01-15 DIAGNOSIS — Z83719 Family history of colon polyps, unspecified: Secondary | ICD-10-CM | POA: Diagnosis not present

## 2023-01-15 DIAGNOSIS — Z8042 Family history of malignant neoplasm of prostate: Secondary | ICD-10-CM

## 2023-01-15 DIAGNOSIS — G629 Polyneuropathy, unspecified: Secondary | ICD-10-CM | POA: Diagnosis present

## 2023-01-15 DIAGNOSIS — M25561 Pain in right knee: Secondary | ICD-10-CM | POA: Diagnosis not present

## 2023-01-15 DIAGNOSIS — Z806 Family history of leukemia: Secondary | ICD-10-CM | POA: Diagnosis not present

## 2023-01-15 LAB — CBC
HCT: 44.9 % (ref 39.0–52.0)
Hemoglobin: 16.1 g/dL (ref 13.0–17.0)
MCH: 31.2 pg (ref 26.0–34.0)
MCHC: 35.9 g/dL (ref 30.0–36.0)
MCV: 87 fL (ref 80.0–100.0)
Platelets: 349 10*3/uL (ref 150–400)
RBC: 5.16 MIL/uL (ref 4.22–5.81)
RDW: 13.1 % (ref 11.5–15.5)
WBC: 13.9 10*3/uL — ABNORMAL HIGH (ref 4.0–10.5)
nRBC: 0 % (ref 0.0–0.2)

## 2023-01-15 LAB — BASIC METABOLIC PANEL
Anion gap: 12 (ref 5–15)
BUN: 14 mg/dL (ref 8–23)
CO2: 22 mmol/L (ref 22–32)
Calcium: 7.6 mg/dL — ABNORMAL LOW (ref 8.9–10.3)
Chloride: 97 mmol/L — ABNORMAL LOW (ref 98–111)
Creatinine, Ser: 0.81 mg/dL (ref 0.61–1.24)
GFR, Estimated: >60 mL/min (ref >60)
Glucose, Bld: 82 mg/dL (ref 70–99)
Potassium: 3.5 mmol/L (ref 3.5–5.1)
Sodium: 131 mmol/L — ABNORMAL LOW (ref 135–145)

## 2023-01-15 LAB — SEDIMENTATION RATE: Sed Rate: 9 mm/h (ref 0–20)

## 2023-01-15 MED ORDER — HYDROCHLOROTHIAZIDE 12.5 MG PO TABS
12.5000 mg | ORAL_TABLET | Freq: Every day | ORAL | Status: DC
  Administered 2023-01-16 – 2023-01-18 (×3): 12.5 mg via ORAL
  Filled 2023-01-15 (×3): qty 1

## 2023-01-15 MED ORDER — ONDANSETRON HCL 4 MG PO TABS: 4.0000 mg | ORAL_TABLET | Freq: Four times a day (QID) | ORAL | Status: DC | PRN

## 2023-01-15 MED ORDER — LOSARTAN POTASSIUM 50 MG PO TABS
50.0000 mg | ORAL_TABLET | Freq: Every day | ORAL | Status: DC
  Administered 2023-01-16 – 2023-01-18 (×3): 50 mg via ORAL
  Filled 2023-01-15 (×3): qty 1

## 2023-01-15 MED ORDER — ACETAMINOPHEN 650 MG RE SUPP: 650.0000 mg | Freq: Four times a day (QID) | RECTAL | Status: DC | PRN

## 2023-01-15 MED ORDER — MORPHINE SULFATE (PF) 4 MG/ML IV SOLN
4.0000 mg | INTRAVENOUS | Status: DC | PRN
  Administered 2023-01-15 – 2023-01-16 (×4): 4 mg via INTRAVENOUS
  Filled 2023-01-15 (×4): qty 1

## 2023-01-15 MED ORDER — LOSARTAN POTASSIUM-HCTZ 50-12.5 MG PO TABS: 1.0000 | ORAL_TABLET | Freq: Every day | ORAL | Status: DC

## 2023-01-15 MED ORDER — SODIUM CHLORIDE 0.9 % IV SOLN
1.0000 g | Freq: Once | INTRAVENOUS | Status: AC
  Administered 2023-01-15: 1 g via INTRAVENOUS
  Filled 2023-01-15: qty 10

## 2023-01-15 MED ORDER — ENOXAPARIN SODIUM 60 MG/0.6ML IJ SOSY
0.5000 mg/kg | PREFILLED_SYRINGE | INTRAMUSCULAR | Status: DC
  Administered 2023-01-15 – 2023-01-17 (×3): 47.5 mg via SUBCUTANEOUS
  Filled 2023-01-15 (×3): qty 0.6

## 2023-01-15 MED ORDER — OXYCODONE HCL 5 MG PO TABS
5.0000 mg | ORAL_TABLET | ORAL | Status: DC | PRN
  Administered 2023-01-16 – 2023-01-17 (×3): 5 mg via ORAL
  Filled 2023-01-15 (×3): qty 1

## 2023-01-15 MED ORDER — SODIUM CHLORIDE 0.9 % IV SOLN
1.0000 g | INTRAVENOUS | Status: DC
  Administered 2023-01-16 – 2023-01-17 (×2): 1 g via INTRAVENOUS
  Filled 2023-01-15 (×3): qty 10

## 2023-01-15 MED ORDER — KETOROLAC TROMETHAMINE 30 MG/ML IJ SOLN
15.0000 mg | Freq: Once | INTRAMUSCULAR | Status: AC
  Administered 2023-01-15: 15 mg via INTRAVENOUS
  Filled 2023-01-15: qty 1

## 2023-01-15 MED ORDER — ONDANSETRON HCL 4 MG/2ML IJ SOLN: 4.0000 mg | Freq: Four times a day (QID) | INTRAMUSCULAR | Status: DC | PRN

## 2023-01-15 MED ORDER — ONDANSETRON HCL 4 MG/2ML IJ SOLN
4.0000 mg | Freq: Once | INTRAMUSCULAR | Status: AC
  Administered 2023-01-15: 4 mg via INTRAVENOUS
  Filled 2023-01-15: qty 2

## 2023-01-15 MED ORDER — KETOROLAC TROMETHAMINE 30 MG/ML IJ SOLN: 30.0000 mg | Freq: Four times a day (QID) | INTRAMUSCULAR | Status: DC | PRN

## 2023-01-15 MED ORDER — DULOXETINE HCL 60 MG PO CPEP
60.0000 mg | ORAL_CAPSULE | Freq: Every day | ORAL | Status: DC
  Filled 2023-01-15 (×3): qty 1
  Filled 2023-01-15: qty 2

## 2023-01-15 MED ORDER — VANCOMYCIN HCL IN DEXTROSE 1-5 GM/200ML-% IV SOLN
1000.0000 mg | Freq: Two times a day (BID) | INTRAVENOUS | Status: DC
  Administered 2023-01-16 – 2023-01-17 (×3): 1000 mg via INTRAVENOUS
  Filled 2023-01-15 (×3): qty 200

## 2023-01-15 MED ORDER — VANCOMYCIN HCL 1750 MG/350ML IV SOLN
1750.0000 mg | Freq: Once | INTRAVENOUS | Status: AC
  Administered 2023-01-15: 1750 mg via INTRAVENOUS
  Filled 2023-01-15: qty 350

## 2023-01-15 MED ORDER — ACETAMINOPHEN 325 MG PO TABS
650.0000 mg | ORAL_TABLET | Freq: Four times a day (QID) | ORAL | Status: DC | PRN
  Administered 2023-01-18: 650 mg via ORAL
  Filled 2023-01-15: qty 2

## 2023-01-15 NOTE — Progress Notes (Signed)
PHARMACIST - PHYSICIAN COMMUNICATION  CONCERNING:  Enoxaparin (Lovenox) for DVT Prophylaxis    RECOMMENDATION: Patient was prescribed enoxaprin 40mg  q24 hours for VTE prophylaxis.   There were no vitals filed for this visit.  There is no height or weight on file to calculate BMI.  Estimated Creatinine Clearance: 107.3 mL/min (by C-G formula based on SCr of 0.81 mg/dL).  Based on [facility] policy patient is candidate for enoxaparin 0.5mg /kg TBW SQ every 24 hours based on BMI being >30.  DESCRIPTION: Pharmacy has adjusted enoxaparin dose per [facility] policy.  Patient is now receiving enoxaparin 47.5 mg every 24 hours    Rockwell Alexandria, PharmD Clinical Pharmacist  01/15/2023 8:01 PM

## 2023-01-15 NOTE — ED Notes (Signed)
ED TO INPATIENT HANDOFF REPORT  ED Nurse Name and Phone #:   S Name/Age/Gender Jesse Diaz 62 y.o. male Room/Bed: ED47A/ED47A  Code Status   Code Status: Full Code  Home/SNF/Other Home Patient oriented to: self, place, time, and situation Is this baseline? Yes   Triage Complete: Triage complete  Chief Complaint Cellulitis of right leg [L03.115]  Triage Note Pt c/o R knee pain for approximately one week. Knee appears reddened and swollen per pt. Pt denies known trauma/injury. Pt recently evaluated for septic joint versus cellulitis.    Allergies No Known Allergies  Level of Care/Admitting Diagnosis ED Disposition     ED Disposition  Admit   Condition  --   Comment  Hospital Area: [facility] [[id number]]  Level of Care: Med-Surg [16]  Covid Evaluation: Asymptomatic - no recent exposure (last 10 days) testing not required  Diagnosis: Cellulitis of right leg [536144]  Admitting Physician: Andris Baumann [[id number]]  Attending Physician: Andris Baumann [[id number]]  Certification:: I certify this patient will need inpatient services for at least 2 midnights  Expected Medical Readiness: 01/17/2023          B Medical/Surgery History Past Medical History:  Diagnosis Date   Anxiety    Asthma    Depression    GERD (gastroesophageal reflux disease)    in past   Hypertension    Sinus drainage    Wears dentures    partial lower   Past Surgical History:  Procedure Laterality Date   CARDIOVASCULAR STRESS TEST     approx 8 yrs ago - anxiety/reflux   COLONOSCOPY     COLONOSCOPY WITH PROPOFOL N/A 04/02/2016   Procedure: COLONOSCOPY WITH PROPOFOL;  Surgeon: Scot Jun, MD;  Location: [facility] ENDOSCOPY;  Service: Endoscopy;  Laterality: N/A;   ESOPHAGEAL DILATION     striction   ESOPHAGOGASTRODUODENOSCOPY (EGD) WITH PROPOFOL N/A 04/02/2016   Procedure: ESOPHAGOGASTRODUODENOSCOPY (EGD) WITH PROPOFOL;  Surgeon: Scot Jun, MD;  Location:  [facility] ENDOSCOPY;  Service: Endoscopy;  Laterality: N/A;   EXTRACORPOREAL SHOCK WAVE LITHOTRIPSY Right 12/25/2015   Procedure: EXTRACORPOREAL SHOCK WAVE LITHOTRIPSY (ESWL);  Surgeon: Orson Ape, MD;  Location: [facility] ORS;  Service: Urology;  Laterality: Right;   KNEE ARTHROSCOPY Bilateral    KNEE ARTHROSCOPY     SEPTOPLASTY N/A 04/04/2015   Procedure: SEPTOPLASTY;  Surgeon: Linus Salmons, MD;  Location: [facility]ERY [facility];  Service: ENT;  Laterality: N/A;   TONSILLECTOMY     TURBINATE REDUCTION N/A 04/04/2015   Procedure: TURBINATE REDUCTION SMR;  Surgeon: Linus Salmons, MD;  Location: [facility] SURGERY [facility];  Service: ENT;  Laterality: N/A;   UPPER GI ENDOSCOPY       A IV Location/Drains/Wounds Patient Lines/Drains/Airways Status     Active Line/Drains/Airways     Name Placement date Placement time Site Days   Peripheral IV 01/15/23 20 G Anterior;Right Forearm 01/15/23  1538  Forearm  less than 1   Airway 04/02/16  0928  -- 2479            Intake/Output Last 24 hours  Intake/Output Summary (Last 24 hours) at 01/15/2023 2015 Last data filed at 01/15/2023 1853 Gross per 24 hour  Intake 336.16 ml  Output --  Net 336.16 ml    Labs/Imaging Results for orders placed or performed during the hospital encounter of 01/15/23 (from the past 48 hour(s))  CBC     Status: Abnormal   Collection Time: 01/15/23 11:51 AM  Result Value Ref Range  WBC 13.9 (H) 4.0 - 10.5 K/uL   RBC 5.16 4.22 - 5.81 MIL/uL   Hemoglobin 16.1 13.0 - 17.0 g/dL   HCT 52.8 41.3 - 24.4 %   MCV 87.0 80.0 - 100.0 fL   MCH 31.2 26.0 - 34.0 pg   MCHC 35.9 30.0 - 36.0 g/dL   RDW 01.0 27.2 - 53.6 %   Platelets 349 150 - 400 K/uL   nRBC 0.0 0.0 - 0.2 %    Comment: Performed at [facility], [address] [place], [place] [address]  Basic metabolic panel     Status: None   Collection Time: 01/15/23 11:51 AM  Result Value Ref Range   GFR, Estimated NOT CALCULATED >60 mL/min    Comment:  (NOTE) Calculated using the CKD-EPI Creatinine Equation (2021) Performed at [facility], [address] [place], [place] [address]   Sedimentation rate     Status: None   Collection Time: 01/15/23 11:51 AM  Result Value Ref Range   Sed Rate 9 0 - 20 mm/hr    Comment: Performed at [facility], [address] [place], [place] [address]  Basic metabolic panel     Status: Abnormal   Collection Time: 01/15/23  5:45 PM  Result Value Ref Range   Sodium 131 (L) 135 - 145 mmol/L   Potassium 3.5 3.5 - 5.1 mmol/L   Chloride 97 (L) 98 - 111 mmol/L   CO2 22 22 - 32 mmol/L   Glucose, Bld 82 70 - 99 mg/dL    Comment: Glucose reference range applies only to samples taken after fasting for at least 8 hours.   BUN 14 8 - 23 mg/dL   Creatinine, Ser 7.56 0.61 - 1.24 mg/dL   Calcium 7.6 (L) 8.9 - 10.3 mg/dL   GFR, Estimated >43 >32 mL/min    Comment: (NOTE) Calculated using the CKD-EPI Creatinine Equation (2021)    Anion gap 12 5 - 15    Comment: Performed at [facility], [address] [place], [place] [address]   No results found.  Pending Labs Unresulted Labs (From admission, onward)     Start     Ordered   01/16/23 0500  CBC  Tomorrow morning,   R        01/15/23 1949   01/16/23 0500  Basic metabolic panel  Tomorrow morning,   R        01/15/23 1949   01/15/23 1949  HIV Antibody (routine testing w rflx)  (HIV Antibody (Routine testing w reflex) panel)  Once,   R        01/15/23 1949   01/15/23 1151  C-reactive protein  Once,   URGENT        01/15/23 1150            Vitals/Pain Today's Vitals   01/15/23 1804 01/15/23 1900 01/15/23 1915 01/15/23 1930  BP: 128/80 128/71  (!) 150/82  Pulse: 60 64    Resp: 18     Temp: 98 F (36.7 C)     TempSrc: Oral     SpO2: 98% 97%    PainSc:   7      Isolation Precautions No active isolations  Medications Medications  morphine (PF) 4 MG/ML injection 4 mg (4 mg Intravenous Given 01/15/23 1758)   losartan-hydrochlorothiazide (HYZAAR) 50-12.5 MG per tablet 1 tablet (has no administration in time range)  DULoxetine (CYMBALTA) DR capsule 60 mg (has no administration in time range)  enoxaparin (  LOVENOX) injection 47.5 mg (has no administration in time range)  acetaminophen (TYLENOL) tablet 650 mg (has no administration in time range)    Or  acetaminophen (TYLENOL) suppository 650 mg (has no administration in time range)  ondansetron (ZOFRAN) tablet 4 mg (has no administration in time range)    Or  ondansetron (ZOFRAN) injection 4 mg (has no administration in time range)  cefTRIAXone (ROCEPHIN) 1 g in sodium chloride 0.9 % 100 mL IVPB (has no administration in time range)  ketorolac (TORADOL) 30 MG/ML injection 30 mg (has no administration in time range)  oxyCODONE (Oxy IR/ROXICODONE) immediate release tablet 5 mg (has no administration in time range)  ondansetron (ZOFRAN) injection 4 mg (4 mg Intravenous Given 01/15/23 1539)  vancomycin (VANCOREADY) IVPB 1750 mg/350 mL (0 mg Intravenous Stopped 01/15/23 1853)  ketorolac (TORADOL) 30 MG/ML injection 15 mg (15 mg Intravenous Given 01/15/23 1933)  cefTRIAXone (ROCEPHIN) 1 g in sodium chloride 0.9 % 100 mL IVPB (1 g Intravenous New Bag/Given 01/15/23 1935)    Mobility walks     Focused Assessments    R Recommendations: See Admitting Provider Note  Report given to:   Additional Notes:

## 2023-01-15 NOTE — Assessment & Plan Note (Signed)
CPAP nightly

## 2023-01-15 NOTE — ED Triage Notes (Signed)
Pt c/o R knee pain for approximately one week. Knee appears reddened and swollen per pt. Pt denies known trauma/injury. Pt recently evaluated for septic joint versus cellulitis.

## 2023-01-15 NOTE — Progress Notes (Signed)
PHARMACY -  BRIEF ANTIBIOTIC NOTE   Pharmacy has received consult(s) for vancomycin from an ED provider.  The patient's profile has been reviewed for ht/wt/allergies/indication/available labs.    One time order(s) placed for vancomycin.  Further antibiotics/pharmacy consults should be ordered by admitting physician if indicated.                       Thank you for involving pharmacy in this patient's care.   Rockwell Alexandria, PharmD Clinical Pharmacist 01/15/2023 3:26 PM

## 2023-01-15 NOTE — Progress Notes (Signed)
Pharmacy Antibiotic Note  Jesse Diaz is a 63 y.o. male admitted on 01/15/2023 with cellulitis. Patient presenting with worsening redness and swelling to right knee after recent outpatient treatment for acute gout flare of right knee and a course of Bactrim for possible septic arthritis. In ED, patient is afebrile with WBC 13.9. Pharmacy has been consulted for vancomycin dosing.  Plan: Vancomycin 1750 mg IV load given in ED Start vancomycin 1000 mg IV every 12 hours (eAUC 516.2, Scr 0.81, Vd 0.5 L/kg) Monitor renal function, clinical status, and LOT  Temp (24hrs), Avg:98 F (36.7 C), Min:98 F (36.7 C), Max:98 F (36.7 C)  Recent Labs  Lab 01/11/23 1855 01/15/23 1151 01/15/23 1745  WBC 15.6* 13.9*  --   CREATININE 0.74  --  0.81    Estimated Creatinine Clearance: 107.3 mL/min (by C-G formula based on SCr of 0.81 mg/dL).    No Known Allergies  Antimicrobials this admission: ceftriaxone 9/14 >>  vancomycin 9/14 >>   Dose adjustments this admission: N/A  Microbiology results: N/A  Thank you for involving pharmacy in this patient's care.   Rockwell Alexandria, PharmD Clinical Pharmacist 01/15/2023 8:13 PM

## 2023-01-15 NOTE — Assessment & Plan Note (Signed)
Continue home duloxetine

## 2023-01-15 NOTE — ED Provider Notes (Signed)
[facility] Provider Note    Event Date/Time   First MD Initiated Contact with Patient 01/15/23 1422     (approximate)   History   Knee Pain   HPI  Jesse Diaz is a 62 y.o. male presents to the ER for evaluation of worsening right anterior knee pain.  Has had several encounters recently for right knee pain.  Initially told it was gout placed on steroids without improvement came back to the ER this past week had arthrocentesis performed which was negative for gout or septic arthritis.  Was placed on Bactrim due to suspected cellulitis patient has been compliant with this medication since Wednesday but having worsening pain to the point where he is having difficulty walking worsening redness and heat of the anterior knee.  Denies any trauma.     Physical Exam   Triage Vital Signs: ED Triage Vitals [01/15/23 1123]  Encounter Vitals Group     BP (!) 146/86     Systolic BP Percentile      Diastolic BP Percentile      Pulse Rate 69     Resp 18     Temp 98 F (36.7 C)     Temp src      SpO2 96 %     Weight      Height      Head Circumference      Peak Flow      Pain Score 8     Pain Loc      Pain Education      Exclude from Growth Chart     Most recent vital signs: Vitals:   01/15/23 1123 01/15/23 1804  BP: (!) 146/86 128/80  Pulse: 69 60  Resp: 18 18  Temp: 98 F (36.7 C) 98 F (36.7 C)  SpO2: 96% 98%     Constitutional: Alert  Eyes: Conjunctivae are normal.  Head: Atraumatic. Nose: No congestion/rhinnorhea. Mouth/Throat: Mucous membranes are moist.   Neck: Painless ROM.  Cardiovascular:   Good peripheral circulation. Respiratory: Normal respiratory effort.  No retractions.  Gastrointestinal: Soft and nontender.  Musculoskeletal: Right prepatellar area is erythematous with cellulitic changes and streaking erythema proximally and laterally.  Significant warmth.  No effusion. Neurologic:  MAE spontaneously. No gross focal  neurologic deficits are appreciated.  Skin:  Skin is warm, dry and intact. No rash noted. Psychiatric: Mood and affect are normal. Speech and behavior are normal.    ED Results / Procedures / Treatments   Labs (all labs ordered are listed, but only abnormal results are displayed) Labs Reviewed  CBC - Abnormal; Notable for the following components:      Result Value   WBC 13.9 (*)    All other components within normal limits  BASIC METABOLIC PANEL - Abnormal; Notable for the following components:   Sodium 131 (*)    Chloride 97 (*)    Calcium 7.6 (*)    All other components within normal limits  BASIC METABOLIC PANEL  SEDIMENTATION RATE  C-REACTIVE PROTEIN     EKG     RADIOLOGY    PROCEDURES:  Critical Care performed:   Procedures   MEDICATIONS ORDERED IN ED: Medications  morphine (PF) 4 MG/ML injection 4 mg (4 mg Intravenous Given 01/15/23 1758)  ketorolac (TORADOL) 30 MG/ML injection 15 mg (has no administration in time range)  cefTRIAXone (ROCEPHIN) 1 g in sodium chloride 0.9 % 100 mL IVPB (has no administration in time range)  ondansetron (ZOFRAN)  injection 4 mg (4 mg Intravenous Given 01/15/23 1539)  vancomycin (VANCOREADY) IVPB 1750 mg/350 mL (1,750 mg Intravenous New Bag/Given 01/15/23 1653)     IMPRESSION / MDM / ASSESSMENT AND PLAN / ED COURSE  I reviewed the triage vital signs and the nursing notes.                              Differential diagnosis includes, but is not limited to, cellulitis, bursitis, arthritis,  Patient presenting to the ER for evaluation of symptoms as described above.  Based on symptoms, risk factors and considered above differential, this presenting complaint could reflect a potentially life-threatening illness therefore the patient will be placed on continuous pulse oximetry and telemetry for monitoring.  Laboratory evaluation will be sent to evaluate for the above complaints.     Clinical Course as of 01/15/23 1901  Sat  Jan 15, 2023  1859 Patient reassessed.  Still having quite significant pain.  Will also give dose of IV Rocephin.  Mild leukocytosis but the patient has been on 4 days of antibiotics without improvement I do think he would benefit from admission to the hospital for IV antibiotics will consult hospitalist. [PR]    Clinical Course User Index [PR] Willy Eddy, MD     FINAL CLINICAL IMPRESSION(S) / ED DIAGNOSES   Final diagnoses:  Acute pain of right knee  Cellulitis of knee, right     Rx / DC Orders   ED Discharge Orders     None        Note:  This document was prepared using Dragon voice recognition software and may include unintentional dictation errors.    Willy Eddy, MD 01/15/23 1901

## 2023-01-15 NOTE — Assessment & Plan Note (Signed)
Continue home losartan-HCTZ. ?

## 2023-01-15 NOTE — H&P (Signed)
History and Physical    Patient: Jesse Diaz UVO:[id number] DOB: 01/23/1961 DOA: 01/15/2023 DOS: the patient was seen and examined on 01/15/2023 PCP: Patient, No Pcp Per  Patient coming from: Home  Chief Complaint:  Chief Complaint  Patient presents with   Knee Pain    HPI: Jesse Diaz is a 62 y.o. male with medical history significant for HTN, OSA, depression, who presents to the ED with persistent pain and redness of the right knee not responding to outpatient treatment.  Patient was previously treated empirically in the outpatient setting for gout of the knee with steroids but when he failed to improve, he presented to the ED on 9/10.  Due to concern for septic arthritis, joint aspiration was done that was negative for septic joint and he was started on Bactrim.  He returns to the ED with continued pain redness and swelling.  He denies fever or chills. ED course and data review: Vitals within normal limits Labs notable for leukocytosis of 13,900, slightly down from 15,600 about 4 days prior.  Sed rate 9.  Sodium 131 Patient started on vancomycin and ceftriaxone for cellulitis/prepatellar bursitis failing outpatient therapy.  He was treated with morphine and Toradol for pain but continued to have ambulatory dysfunction. Hospitalist consulted for admission.   Review of Systems: As mentioned in the history of present illness. All other systems reviewed and are negative.  Past Medical History:  Diagnosis Date   Anxiety    Asthma    Depression    GERD (gastroesophageal reflux disease)    in past   Hypertension    Sinus drainage    Wears dentures    partial lower   Past Surgical History:  Procedure Laterality Date   CARDIOVASCULAR STRESS TEST     approx 8 yrs ago - anxiety/reflux   COLONOSCOPY     COLONOSCOPY WITH PROPOFOL N/A 04/02/2016   Procedure: COLONOSCOPY WITH PROPOFOL;  Surgeon: Scot Jun, MD;  Location: [facility] ENDOSCOPY;  Service: Endoscopy;  Laterality: N/A;    ESOPHAGEAL DILATION     striction   ESOPHAGOGASTRODUODENOSCOPY (EGD) WITH PROPOFOL N/A 04/02/2016   Procedure: ESOPHAGOGASTRODUODENOSCOPY (EGD) WITH PROPOFOL;  Surgeon: Scot Jun, MD;  Location: [facility] ENDOSCOPY;  Service: Endoscopy;  Laterality: N/A;   EXTRACORPOREAL SHOCK WAVE LITHOTRIPSY Right 12/25/2015   Procedure: EXTRACORPOREAL SHOCK WAVE LITHOTRIPSY (ESWL);  Surgeon: Orson Ape, MD;  Location: [facility] ORS;  Service: Urology;  Laterality: Right;   KNEE ARTHROSCOPY Bilateral    KNEE ARTHROSCOPY     SEPTOPLASTY N/A 04/04/2015   Procedure: SEPTOPLASTY;  Surgeon: Linus Salmons, MD;  Location: [facility];  Service: ENT;  Laterality: N/A;   TONSILLECTOMY     TURBINATE REDUCTION N/A 04/04/2015   Procedure: TURBINATE REDUCTION SMR;  Surgeon: Linus Salmons, MD;  Location: [facility] SUR[facility]ERY [facility];  Service: ENT;  Laterality: N/A;   UPPER GI ENDOSCOPY     Social History:  reports that he has never smoked. He quit smokeless tobacco use about 16 years ago. He reports that he does not drink alcohol and does not use drugs.  No Known Allergies  Family History  Problem Relation Age of Onset   Stroke Mother    Colon polyps Mother    Hypertension Mother    Leukemia Father    Cancer Father        prostate   Heart attack Father    Stroke Father    Colon polyps Sister    Cancer Maternal Aunt  Diabetes Maternal Aunt    Hypertension Maternal Aunt    Cancer Maternal Uncle        prostate   Diabetes Maternal Uncle    Hypertension Maternal Uncle     Prior to Admission medications   Medication Sig Start Date End Date Taking? Authorizing Provider  lamoTRIgine (LAMICTAL) 100 MG tablet Take 100 mg by mouth daily.   Yes [provider]  albuterol (VENTOLIN HFA) 108 (90 Base) MCG/ACT inhaler TAKE 2 PUFFS BY MOUTH EVERY 6 HOURS AS NEEDED FOR WHEEZE OR SHORTNESS OF BREATH 12/31/18   Particia Nearing, PA-C  amoxicillin-clavulanate (AUGMENTIN) 875-125 MG tablet Take 1 tablet  by mouth 2 (two) times daily. 01/30/21   [name], PA-C  benzonatate (TESSALON) 100 MG capsule Take 1 capsule (100 mg total) by mouth 3 (three) times daily as needed. 01/30/21   [name], PA-C  Biotin 82956 MCG TABS Take 10,000 mg by mouth daily.    [provider]  CALCIUM CITRATE PO Take 750 mg by mouth daily.    [provider]  co-enzyme Q-10 30 MG capsule Take 30 mg by mouth daily.    [provider]  DULoxetine (CYMBALTA) 20 MG capsule TAKE ONE CAPSULE BY MOUTH DAILY FOR 1 TO 2 WEEKS, 2 CAPSULES DAILY FOR 1 TO 2 WEEKS, THEN CONTINUE AT 3 CAPSULES DAILY 01/19/21   Larae Grooms, NP  furosemide (LASIX) 20 MG tablet Take 1 tablet (20 mg total) by mouth daily. 06/09/20   Johnson, Megan P, DO  HYDROcodone-acetaminophen (NORCO) 5-325 MG tablet Take 1 tablet by mouth every 6 (six) hours as needed for moderate pain. 01/11/23   Evon Slack, PA-C  losartan-hydrochlorothiazide (HYZAAR) 50-12.5 MG tablet Take 1 tablet by mouth daily. 01/19/21   Larae Grooms, NP  Magnesium 250 MG TABS Take 250 mg by mouth 2 (two) times daily.    [provider]  meloxicam (MOBIC) 7.5 MG tablet Take 7.5 mg by mouth daily. 04/19/20   [provider]  Potassium 99 MG TABS Take 99 mg by mouth daily.    [provider]  predniSONE (DELTASONE) 20 MG tablet Take 1 tablet (20 mg total) by mouth daily with breakfast. 01/30/21   [name], PA-C  rOPINIRole (REQUIP) 0.5 MG tablet Take 1 tablet (0.5 mg total) by mouth 3 (three) times daily. 06/09/20   Johnson, Megan P, DO  sulfamethoxazole-trimethoprim (BACTRIM DS) 800-160 MG tablet Take 1 tablet by mouth 2 (two) times daily for 10 days. 01/11/23 01/21/23  Evon Slack, PA-C  TURMERIC PO Take by mouth.    [provider]  UNABLE TO FIND Super Oxicell    [provider]    Physical Exam: Vitals:   01/15/23 1123 01/15/23 1804 01/15/23 1900 01/15/23 1930  BP: (!) 146/86  128/80 128/71 (!) 150/82  Pulse: 69 60 64   Resp: 18 18    Temp: 98 F (36.7 C) 98 F (36.7 C)    TempSrc:  Oral    SpO2: 96% 98% 97%    Physical Exam  Labs on Admission: I have personally reviewed following labs and imaging studies  CBC: Recent Labs  Lab 01/11/23 1855 01/15/23 1151  WBC 15.6* 13.9*  NEUTROABS 12.7*  --   HGB 14.2 16.1  HCT 40.9 44.9  MCV 89.3 87.0  PLT 307 349   Basic Metabolic Panel: Recent Labs  Lab 01/11/23 1855 01/15/23 1745  NA 139 131*  K 3.9 3.5  CL 104  97*  CO2 23 22  GLUCOSE 118* 82  BUN 26* 14  CREATININE 0.74 0.81  CALCIUM 8.8* 7.6*   GFR: Estimated Creatinine Clearance: 107.3 mL/min (by C-G formula based on SCr of 0.81 mg/dL). Liver Function Tests: Recent Labs  Lab 01/11/23 1855  AST 19  ALT 31  ALKPHOS 54  BILITOT 0.6  PROT 6.8  ALBUMIN 3.4*   No results for input(s): "LIPASE", "AMYLASE" in the last 168 hours. No results for input(s): "AMMONIA" in the last 168 hours. Coagulation Profile: No results for input(s): "INR", "PROTIME" in the last 168 hours. Cardiac Enzymes: No results for input(s): "CKTOTAL", "CKMB", "CKMBINDEX", "TROPONINI" in the last 168 hours. BNP (last 3 results) No results for input(s): "PROBNP" in the last 8760 hours. HbA1C: No results for input(s): "HGBA1C" in the last 72 hours. CBG: No results for input(s): "GLUCAP" in the last 168 hours. Lipid Profile: No results for input(s): "CHOL", "HDL", "LDLCALC", "TRIG", "CHOLHDL", "LDLDIRECT" in the last 72 hours. Thyroid Function Tests: No results for input(s): "TSH", "T4TOTAL", "FREET4", "T3FREE", "THYROIDAB" in the last 72 hours. Anemia Panel: No results for input(s): "VITAMINB12", "FOLATE", "FERRITIN", "TIBC", "IRON", "RETICCTPCT" in the last 72 hours. Urine analysis:    Component Value Date/Time   APPEARANCEUR Clear 01/19/2021 0857   GLUCOSEU Negative 01/19/2021 0857   BILIRUBINUR Negative 01/19/2021 0857   PROTEINUR Trace (A) 01/19/2021 0857    NITRITE Negative 01/19/2021 0857   LEUKOCYTESUR Negative 01/19/2021 0857    Radiological Exams on Admission: No results found.   Data Reviewed: Relevant notes from primary care and specialist visits, past discharge summaries as available in EHR, including Care Everywhere. Prior diagnostic testing as pertinent to current admission diagnoses Updated medications and problem lists for reconciliation ED course, including vitals, labs, imaging, treatment and response to treatment Triage notes, nursing and pharmacy notes and ED provider's notes Notable results as noted in HPI   Assessment and Plan: Cellulitis of right leg Possible prepatellar bursitis Continue Rocephin and azithromycin Pain control, acetaminophen, NSAIDs, opioids, cool compresses Can consider Ortho consult  OSA (obstructive sleep apnea) CPAP nightly  Depression with anxiety Continue home duloxetine  Benign essential hypertension Continue home losartan/HCTZ        DVT prophylaxis: Lovenox  Consults: none  Advance Care Planning: full code  Family Communication: none  Disposition Plan: Back to previous home environment  Severity of Illness: The appropriate patient status for this patient is INPATIENT. Inpatient status is judged to be reasonable and necessary in order to provide the required intensity of service to ensure the patient's safety. The patient's presenting symptoms, physical exam findings, and initial radiographic and laboratory data in the context of their chronic comorbidities is felt to place them at high risk for further clinical deterioration. Furthermore, it is not anticipated that the patient will be medically stable for discharge from the hospital within 2 midnights of admission.   * I certify that at the point of admission it is my clinical judgment that the patient will require inpatient hospital care spanning beyond 2 midnights from the point of admission due to high intensity of  service, high risk for further deterioration and high frequency of surveillance required.*  Author: Andris Baumann, MD 01/15/2023 7:45 PM  For on call review [email or web address].

## 2023-01-15 NOTE — ED Notes (Signed)
Request made for transport to the floor ?

## 2023-01-15 NOTE — Assessment & Plan Note (Signed)
X-ray on 9/10 showed subcutaneous edema anterior to the patella and patella tendon Sed rate normal at 9, CRP slightly elevated at 1.2 Continue Rocephin and azithromycin Pain control, acetaminophen, NSAIDs, opioids, cool compresses Can consider Ortho consult

## 2023-01-16 ENCOUNTER — Other Ambulatory Visit: Payer: Self-pay

## 2023-01-16 DIAGNOSIS — L089 Local infection of the skin and subcutaneous tissue, unspecified: Secondary | ICD-10-CM | POA: Diagnosis not present

## 2023-01-16 DIAGNOSIS — L03115 Cellulitis of right lower limb: Secondary | ICD-10-CM

## 2023-01-16 DIAGNOSIS — E871 Hypo-osmolality and hyponatremia: Secondary | ICD-10-CM

## 2023-01-16 LAB — BASIC METABOLIC PANEL
Anion gap: 8 (ref 5–15)
BUN: 19 mg/dL (ref 8–23)
CO2: 26 mmol/L (ref 22–32)
Calcium: 8.4 mg/dL — ABNORMAL LOW (ref 8.9–10.3)
Chloride: 102 mmol/L (ref 98–111)
Creatinine, Ser: 1.02 mg/dL (ref 0.61–1.24)
GFR, Estimated: >60 mL/min (ref >60)
Glucose, Bld: 101 mg/dL — ABNORMAL HIGH (ref 70–99)
Potassium: 4.2 mmol/L (ref 3.5–5.1)
Sodium: 136 mmol/L (ref 135–145)

## 2023-01-16 LAB — HIV ANTIBODY (ROUTINE TESTING W REFLEX): HIV Screen 4th Generation wRfx: NONREACTIVE

## 2023-01-16 LAB — CBC
HCT: 45 % (ref 39.0–52.0)
Hemoglobin: 15.7 g/dL (ref 13.0–17.0)
MCH: 30.7 pg (ref 26.0–34.0)
MCHC: 34.9 g/dL (ref 30.0–36.0)
MCV: 88.1 fL (ref 80.0–100.0)
Platelets: 286 10*3/uL (ref 150–400)
RBC: 5.11 MIL/uL (ref 4.22–5.81)
RDW: 13.1 % (ref 11.5–15.5)
WBC: 10.3 10*3/uL (ref 4.0–10.5)
nRBC: 0 % (ref 0.0–0.2)

## 2023-01-16 LAB — C-REACTIVE PROTEIN: CRP: 1.2 mg/dL — ABNORMAL HIGH (ref <1.0)

## 2023-01-16 MED ORDER — NAPROXEN 500 MG PO TABS
500.0000 mg | ORAL_TABLET | Freq: Two times a day (BID) | ORAL | Status: DC
  Administered 2023-01-16 – 2023-01-18 (×4): 500 mg via ORAL
  Filled 2023-01-16 (×5): qty 1

## 2023-01-16 MED ORDER — HYALURONIDASE HUMAN 150 UNIT/ML IJ SOLN
150.0000 [IU] | Freq: Once | INTRAMUSCULAR | Status: AC
  Administered 2023-01-16: 150 [IU] via SUBCUTANEOUS
  Filled 2023-01-16: qty 1

## 2023-01-16 NOTE — Consult Note (Signed)
ORTHOPAEDIC CONSULTATION  REQUESTING PHYSICIAN: Leeroy Bock, MD  Chief Complaint: Pain and swelling right knee  HPI: Jesse Diaz is a 62 y.o. male who complains of pain and swelling right knee.  This developed several days ago.  He was seen in the emergency room 2 days ago and started on Bactrim.  Initially got better but soreness and redness got worse yesterday and a repeat reported back to the emergency room.  He was admitted for IV antibiotics.  He is not sure what caused the swelling over the front of the knee.  He is not having much pain.  White count is normal today at 10,300.  It was elevated at 13,900 yesterday.  He remains afebrile.  Pain is moderate.  Past Medical History:  Diagnosis Date   Anxiety    Asthma    Depression    GERD (gastroesophageal reflux disease)    in past   Hypertension    Sinus drainage    Wears dentures    partial lower   Past Surgical History:  Procedure Laterality Date   CARDIOVASCULAR STRESS TEST     approx 8 yrs ago - anxiety/reflux   COLONOSCOPY     COLONOSCOPY WITH PROPOFOL N/A 04/02/2016   Procedure: COLONOSCOPY WITH PROPOFOL;  Surgeon: Scot Jun, MD;  Location: [facility] ENDOSCOPY;  Service: Endoscopy;  Laterality: N/A;   ESOPHAGEAL DILATION     striction   ESOPHAGOGASTRODUODENOSCOPY (EGD) WITH PROPOFOL N/A 04/02/2016   Procedure: ESOPHAGOGASTRODUODENOSCOPY (EGD) WITH PROPOFOL;  Surgeon: Scot Jun, MD;  Location: [facility] ENDOSCOPY;  Service: Endoscopy;  Laterality: N/A;   EXTRACORPOREAL SHOCK WAVE LITHOTRIPSY Right 12/25/2015   Procedure: EXTRACORPOREAL SHOCK WAVE LITHOTRIPSY (ESWL);  Surgeon: Orson Ape, MD;  Location: [facility] ORS;  Service: Urology;  Laterality: Right;   KNEE ARTHROSCOPY Bilateral    KNEE ARTHROSCOPY     SEPTOPLASTY N/A 04/04/2015   Procedure: SEPTOPLASTY;  Surgeon: Linus Salmons, MD;  Location: [facility] SURGERY CNTR;  Service: ENT;  Laterality: N/A;   TONSILLECTOMY     TURBINATE REDUCTION N/A 04/04/2015    Procedure: TURBINATE REDUCTION SMR;  Surgeon: Linus Salmons, MD;  Location: [facility] SURGERY [facility]TR;  Service: ENT;  Laterality: N/A;   UPPER GI ENDOSCOPY     Social History   Socioeconomic History   Marital status: Married    Spouse name: Not on file   Number of children: Not on file   Years of education: Not on file   Highest education level: Not on file  Occupational History   Not on file  Tobacco Use   Smoking status: Never   Smokeless tobacco: Former    Quit date: 05/03/2006   Tobacco comments:    quit chew - approx 2008  Vaping Use   Vaping status: Never Used  Substance and Sexual Activity   Alcohol use: No   Drug use: No   Sexual activity: Not Currently  Other Topics Concern   Not on file  Social History Narrative   Not on file   Social Determinants of Health   Financial Resource Strain: Low Risk  (11/30/2022)   Received from [facility]   Overall Financial Resource Strain (CARDIA)    Difficulty of Paying Living Expenses: Not hard at all  Food Insecurity: No Food Insecurity (01/15/2023)   Hunger Vital Sign    Worried About Running Out of Food in the Last Year: Never true    Ran Out of Food in the Last Year: Never true  Transportation  Needs: No Transportation Needs (01/15/2023)   PRAPARE - Administrator, Civil Service (Medical): No    Lack of Transportation (Non-Medical): No  Physical Activity: Not on file  Stress: Not on file  Social Connections: Not on file   Family History  Problem Relation Age of Onset   Stroke Mother    Colon polyps Mother    Hypertension Mother    Leukemia Father    Cancer Father        prostate   Heart attack Father    Stroke Father    Colon polyps Sister    Cancer Maternal Aunt    Diabetes Maternal Aunt    Hypertension Maternal Aunt    Cancer Maternal Uncle        prostate   Diabetes Maternal Uncle    Hypertension Maternal Uncle    No Known Allergies Prior to Admission medications    Medication Sig Start Date End Date Taking? Authorizing Provider  albuterol (VENTOLIN HFA) 108 (90 Base) MCG/ACT inhaler TAKE 2 PUFFS BY MOUTH EVERY 6 HOURS AS NEEDED FOR WHEEZE OR SHORTNESS OF BREATH 12/31/18  Yes Particia Nearing, PA-C  Biotin 96295 MCG TABS Take 10,000 mg by mouth daily.   Yes [provider]  CALCIUM CITRATE PO Take 750 mg by mouth daily.   Yes [provider]  co-enzyme Q-10 30 MG capsule Take 30 mg by mouth daily.   Yes [provider]  HYDROcodone-acetaminophen (NORCO) 5-325 MG tablet Take 1 tablet by mouth every 6 (six) hours as needed for moderate pain. 01/11/23  Yes Evon Slack, PA-C  lamoTRIgine (LAMICTAL) 100 MG tablet Take 100 mg by mouth 2 (two) times daily.   Yes [provider]  losartan-hydrochlorothiazide (HYZAAR) 100-12.5 MG tablet Take 1 tablet by mouth daily. 11/03/22  Yes [provider]  Magnesium 250 MG TABS Take 250 mg by mouth 2 (two) times daily.   Yes [provider]  Potassium 99 MG TABS Take 99 mg by mouth daily.   Yes [provider]  sulfamethoxazole-trimethoprim (BACTRIM DS) 800-160 MG tablet Take 1 tablet by mouth 2 (two) times daily for 10 days. 01/11/23 01/21/23 Yes Evon Slack, PA-C  TURMERIC PO Take by mouth.   Yes [provider]  amoxicillin-clavulanate (AUGMENTIN) 875-125 MG tablet Take 1 tablet by mouth 2 (two) times daily. 01/30/21   [name], PA-C  predniSONE (DELTASONE) 20 MG tablet Take 1 tablet (20 mg total) by mouth daily with breakfast. 01/30/21   Burnette, Alessandra Bevels, PA-C  UNABLE TO FIND Super Oxicell    [provider]   No results found.  Positive ROS: All other systems have been reviewed and were otherwise negative with the exception of those mentioned in the HPI and as above.  Physical Exam: General: Alert, no acute distress Cardiovascular: No pedal edema Respiratory: No cyanosis, no use of accessory musculature GI: No  organomegaly, abdomen is soft and non-tender Skin: No lesions in the area of chief complaint Neurologic: Sensation intact distally Psychiatric: Patient is competent for consent with normal mood and affect Lymphatic: No axillary or cervical lymphadenopathy  MUSCULOSKELETAL: Patient is lying quietly in the hospital bed with his wife at the bedside.  He has mild to moderate redness and discoloration around the prepatellar bursa area of the right knee.  There is no swelling was around the knee joint itself.  He has good passive motion in the joint without pain.  There is no ascending or  descending cellulitis.  Neurovascular status good distally.  X-rays of the knee were normal.    Assessment: Superficial cellulitis from prepatellar bursa right knee  Plan: Continue IV antibiotics. Moist heating pad.  The was no indication for joint aspiration at this time as the joint appears to be benign. Convert to p.o. antibiotics tomorrow probably.    Valinda Hoar, MD [phone]   01/16/2023 1:25 PM

## 2023-01-16 NOTE — Progress Notes (Signed)
PROGRESS NOTE  Jesse Diaz    DOB: 1960-05-29, 62 y.o.  WGN:[id number]    Code Status: Full Code   DOA: 01/15/2023   LOS: 1   Brief hospital course  EKANSH Diaz is a 62 y.o. male with a PMH significant for HTN, OSA, depression.  They presented from home to the ED on 01/15/2023 with redness, pain of right knee x several days.  He had previously been seen in ED for suspected gout but aspiration was negative for crystals (not sent for culture). Had not responded to outpatient steroid injection for suspected gout. He was also trialed on bactrim for cellulitis outpatient without improvement.   In the ED, it was found that they had normal vital signs.  Significant findings included leukocytosis of 13,900, slightly down from 15,600 about 4 days prior.  Sed rate 9.  Sodium 131 X-ray of the knee from 9/10 showed" IMPRESSION: 1. Substantial subcutaneous edema anterior to the patella and patellar tendon. 2. Mild medial compartmental articular space narrowing..  They were initially treated with vancomycin and CTX.   Patient was admitted to medicine service for further workup and management of cellulitis/septic joint as outlined in detail below.  01/16/23 -unchanged status of pain, swelling. Ortho consulted for aspiration and culture  Assessment & Plan  Principal Problem:   Cellulitis of right leg Active Problems:   Hyponatremia   Benign essential hypertension   Depression with anxiety   OSA (obstructive sleep apnea)  R knee infection- significant erythema, heat, pain, and edema of R knee decreasing his ROM and weight bearing tolerance. Cellulitis of soft tissue is likely based on the xray imaging but cannot rule out septic joint. Prior aspiration showed yellow synovial fluid with 36 WBC, and no crystals. Was not sent for culture. He has remote history of bilateral knee arthroscopes but no artificial hardware.  - continue IV Abx - analgesia PRN and starting scheduled anti-inflammatory  treatment.  - continue to elevate knee - ortho consulted to request for culture aspirate - PT consult after showing improvement/stabilization  Mild hyponatremia- Na+ 131> 136. resolved-   HTN- well controlled -  continue home losartan/hydrochlorothiazide for now as there were no crystals on prior aspirate. Low threshold to discontinue thiazide diuretic.   OSA - CPAP QHS  Depression  anxiety- chronic and stable - continue home duloxetine    Body mass index is 32.52 kg/m.  VTE ppx:    Diet:     Diet   Diet Heart Room service appropriate? Yes; Fluid consistency: Thin   Consultants: ortho  Subjective 01/16/23    Pt reports continued pain of right knee and high heat. Pain is moderately well controlled at times but will flare intermittently to 10/10 pain.    Objective   Vitals:   01/15/23 1930 01/15/23 2115 01/15/23 2118 01/16/23 0548  BP: (!) 150/82  126/86 136/79  Pulse:   (!) 57 (!) 52  Resp:   16 16  Temp:   98 F (36.7 C) 98.4 F (36.9 C)  TempSrc:   Oral Oral  SpO2:   95% 96%  Weight:  97 kg    Height:  5\' 8"  (1.727 m)      Intake/Output Summary (Last 24 hours) at 01/16/2023 0720 Last data filed at 01/15/2023 2005 Gross per 24 hour  Intake 436.16 ml  Output --  Net 436.16 ml   Filed Weights   01/15/23 2115  Weight: 97 kg     Physical Exam:  General: awake, alert,  NAD HEENT: atraumatic, clear conjunctiva, anicteric sclera, MMM, hard of hearing Respiratory: normal respiratory effort. Cardiovascular: quick capillary refill Nervous: A&O x3. no gross focal neurologic deficits, normal speech Extremities: R knee edematous compared to L. Significant erythema without definitive borders. Only mild joint effusion appreciated. ROM limited to pain and swelling. Significant temp increase of R knee Psychiatry: normal mood, congruent affect  Labs   I have personally reviewed the following labs and imaging studies CBC    Component Value Date/Time   WBC 10.3  01/16/2023 0424   RBC 5.11 01/16/2023 0424   HGB 15.7 01/16/2023 0424   HGB 15.6 01/19/2021 0909   HCT 45.0 01/16/2023 0424   HCT 44.5 01/19/2021 0909   PLT 286 01/16/2023 0424   PLT 240 01/19/2021 0909   MCV 88.1 01/16/2023 0424   MCV 89 01/19/2021 0909   MCH 30.7 01/16/2023 0424   MCHC 34.9 01/16/2023 0424   RDW 13.1 01/16/2023 0424   RDW 12.6 01/19/2021 0909   LYMPHSABS 1.4 01/11/2023 1855   LYMPHSABS 1.5 01/19/2021 0909   MONOABS 1.3 (H) 01/11/2023 1855   EOSABS 0.0 01/11/2023 1855   EOSABS 0.6 (H) 01/19/2021 0909   BASOSABS 0.0 01/11/2023 1855   BASOSABS 0.1 01/19/2021 0909      Latest Ref Rng & Units 01/16/2023    4:24 AM 01/15/2023    5:45 PM 01/11/2023    6:55 PM  BMP  Glucose 70 - 99 mg/dL 542  82  706   BUN 8 - 23 mg/dL 19  14  26    Creatinine 0.61 - 1.24 mg/dL 2.37  6.28  3.15   Sodium 135 - 145 mmol/L 136  131  139   Potassium 3.5 - 5.1 mmol/L 4.2  3.5  3.9   Chloride 98 - 111 mmol/L 102  97  104   CO2 22 - 32 mmol/L 26  22  23    Calcium 8.9 - 10.3 mg/dL 8.4  7.6  8.8     No results found.  Disposition Plan & Communication  Patient status: Inpatient  Admitted From: Home Planned disposition location: Home Anticipated discharge date: 9/17 pending clinical improvement  Family Communication: none at bedside    Author: Leeroy Bock, DO [facility] 01/16/2023, 7:20 AM   Available by Epic secure chat 7AM-7PM. If 7PM-7AM, please contact night-coverage.  [facility] contact information found on [email or web address].

## 2023-01-16 NOTE — Progress Notes (Signed)
Patient IV infiltrated with vancomycin running, arm appeared slightly red and puffy, grade 1 on infiltration grading scale. Pharmacy and MD notified, new orders placed for treating infiltration site. Hot compress and elevation of right forearm applied and performed, awaiting hylenex injection from pharmacy.

## 2023-01-16 NOTE — Assessment & Plan Note (Signed)
Sodium 131 Continue to monitor

## 2023-01-16 NOTE — Progress Notes (Signed)
Transition of Care [facility]) Screening Note   Patient Details  Name: Jesse Diaz Date of Birth: [birth year]   [facility]) CM/SW Contact:    Hetty Ely, RN Phone Number: 01/16/2023, 8:31 AM    Transition of Care Department [facility]) has reviewed patient and no TOC needs have been identified at this time. We will continue to monitor patient advancement through interdisciplinary progression rounds. If new patient transition needs arise, please place a TOC consult.

## 2023-01-16 NOTE — Plan of Care (Signed)
  Problem: Clinical Measurements: Goal: Ability to avoid or minimize complications of infection will improve Outcome: Progressing   Problem: Skin Integrity: Goal: Skin integrity will improve Outcome: Progressing   Problem: Education: Goal: Knowledge of General Education information will improve Description: Including pain rating scale, medication(s)/side effects and non-pharmacologic comfort measures Outcome: Progressing   Problem: Clinical Measurements: Goal: Diagnostic test results will improve Outcome: Progressing   Problem: Activity: Goal: Risk for activity intolerance will decrease Outcome: Progressing   Problem: Coping: Goal: Level of anxiety will decrease Outcome: Progressing

## 2023-01-17 DIAGNOSIS — M25561 Pain in right knee: Secondary | ICD-10-CM | POA: Diagnosis not present

## 2023-01-17 DIAGNOSIS — L03115 Cellulitis of right lower limb: Secondary | ICD-10-CM | POA: Diagnosis not present

## 2023-01-17 LAB — CREATININE, SERUM
Creatinine, Ser: 0.9 mg/dL (ref 0.61–1.24)
GFR, Estimated: >60 mL/min (ref >60)

## 2023-01-17 MED ORDER — LAMOTRIGINE 100 MG PO TABS
100.0000 mg | ORAL_TABLET | Freq: Two times a day (BID) | ORAL | Status: DC
  Administered 2023-01-17 – 2023-01-18 (×3): 100 mg via ORAL
  Filled 2023-01-17 (×3): qty 1

## 2023-01-17 MED ORDER — VANCOMYCIN HCL 1750 MG/350ML IV SOLN
1750.0000 mg | INTRAVENOUS | Status: DC
  Administered 2023-01-17: 1750 mg via INTRAVENOUS
  Filled 2023-01-17 (×2): qty 350

## 2023-01-17 MED ORDER — POLYETHYLENE GLYCOL 3350 17 G PO PACK
17.0000 g | PACK | Freq: Two times a day (BID) | ORAL | Status: DC
  Administered 2023-01-17 – 2023-01-18 (×3): 17 g via ORAL
  Filled 2023-01-17 (×3): qty 1

## 2023-01-17 NOTE — Plan of Care (Signed)
  Problem: Clinical Measurements: Goal: Ability to avoid or minimize complications of infection will improve Outcome: Progressing   Problem: Skin Integrity: Goal: Skin integrity will improve Outcome: Progressing   Problem: Education: Goal: Knowledge of General Education information will improve Description: Including pain rating scale, medication(s)/side effects and non-pharmacologic comfort measures Outcome: Progressing   Problem: Clinical Measurements: Goal: Diagnostic test results will improve Outcome: Progressing   Problem: Activity: Goal: Risk for activity intolerance will decrease Outcome: Progressing   Problem: Nutrition: Goal: Adequate nutrition will be maintained Outcome: Progressing   Problem: Coping: Goal: Level of anxiety will decrease Outcome: Progressing

## 2023-01-17 NOTE — Progress Notes (Signed)
Subjective:   Patient continues to improve gradually.  He is little less pain today.  Redness and swelling is decreased some.    Patient reports pain as mild.  Objective:   VITALS:   Vitals:   01/17/23 0456 01/17/23 0812  BP: 129/71 (!) 136/93  Pulse:  (!) 54  Resp:  18  Temp: 97.8 F (36.6 C) (!) 97.5 F (36.4 C)  SpO2: 99% 100%   The redness and swelling is decreased some today.  There is no evidence of involvement of the knee joint itself.  Neurovascular status good distally. Neurologically intact Dorsiflexion/Plantar flexion intact  LABS Recent Labs    01/15/23 1151 01/16/23 0424  HGB 16.1 15.7  HCT 44.9 45.0  WBC 13.9* 10.3  PLT 349 286    Recent Labs    01/15/23 1745 01/16/23 0424 01/17/23 0257  NA 131* 136  --   K 3.5 4.2  --   BUN 14 19  --   CREATININE 0.81 1.02 0.90  GLUCOSE 82 101*  --     No results for input(s): "LABPT", "INR" in the last 72 hours.   Assessment/Plan:      Advance diet Up with therapy Switch to p.o. antibiotics when clinically indicated.

## 2023-01-17 NOTE — Progress Notes (Signed)
PROGRESS NOTE  Jesse Diaz    DOB: 10-13-1960, 62 y.o.  ZHY:[id number]    Code Status: Full Code   DOA: 01/15/2023   LOS: 2   Brief hospital course  Jesse Diaz is a 62 y.o. male with a PMH significant for HTN, OSA, depression.  They presented from home to the ED on 01/15/2023 with redness, pain of right knee x several days.  He had previously been seen in ED for suspected gout but aspiration was negative for crystals (not sent for culture). Had not responded to outpatient steroid injection for suspected gout. He was also trialed on bactrim for cellulitis outpatient without improvement.   In the ED, it was found that they had normal vital signs.  Significant findings included leukocytosis of 13,900, slightly down from 15,600 about 4 days prior.  Sed rate 9.  Sodium 131 X-ray of the knee from 9/10 showed" IMPRESSION: 1. Substantial subcutaneous edema anterior to the patella and patellar tendon. 2. Mild medial compartmental articular space narrowing..  They were initially treated with vancomycin and CTX.   Patient was admitted to medicine service for further workup and management of cellulitis/septic joint as outlined in detail below.  01/17/23 -unchanged status of pain, swelling. Ortho consulted for aspiration and culture  Assessment & Plan  Principal Problem:   Cellulitis of right leg Active Problems:   Hyponatremia   Benign essential hypertension   Depression with anxiety   OSA (obstructive sleep apnea)   Right knee skin infection  R knee infection- significant erythema, heat, pain, and edema of R knee decreasing his ROM and weight bearing tolerance. Cellulitis of soft tissue is likely based on the xray imaging but cannot rule out septic joint. Prior aspiration showed yellow synovial fluid with 36 WBC, and no crystals. Was not sent for culture. He has remote history of bilateral knee arthroscopes but no artificial hardware.  - ortho did not perform aspiration. - continue IV  Abx- If continues to improve, can likely transition to PO and dc patient tomorrow - analgesia PRN and starting scheduled anti-inflammatory treatment.  - continue to elevate knee - PT consult after showing improvement/stabilization  Mild hyponatremia- Na+ 131> 136. resolved  HTN- well controlled -  continue home losartan/hydrochlorothiazide for now as there were no crystals on prior aspirate. Low threshold to discontinue thiazide diuretic.   OSA - CPAP QHS  Depression  anxiety- chronic and stable - denies use of duloxetine. Discontinued.   Neuropathy-  - continue home lamotrigine   Body mass index is 32.52 kg/m.  VTE ppx: lovenox  Diet:     Diet   Diet Heart Room service appropriate? Yes; Fluid consistency: Thin   Consultants: ortho  Subjective 01/17/23    Pt reports improvement in his pain and swelling. Still having "tinges" of severe pain. Denies BM since admission but feels that he needs to have one this am.    Objective   Vitals:   01/16/23 0748 01/16/23 1607 01/16/23 2050 01/17/23 0456  BP: 116/83 136/88 139/82 129/71  Pulse: (!) 50 60 (!) 54   Resp: 18 19    Temp: 98.3 F (36.8 C) 98 F (36.7 C) 98.2 F (36.8 C) 97.8 F (36.6 C)  TempSrc:  Oral Oral Oral  SpO2: 98% 97% 97% 99%  Weight:      Height:        Intake/Output Summary (Last 24 hours) at 01/17/2023 0710 Last data filed at 01/16/2023 1633 Gross per 24 hour  Intake 268.16 ml  Output --  Net 268.16 ml   Filed Weights   01/15/23 2115  Weight: 97 kg     Physical Exam:  General: awake, alert, NAD HEENT: atraumatic, clear conjunctiva, anicteric sclera, MMM, hard of hearing Respiratory: normal respiratory effort. Cardiovascular: quick capillary refill Nervous: A&O x3. no gross focal neurologic deficits, normal speech Extremities: R knee edematous compared to L, improved from yesterday. Erythema resolved. Only mild joint effusion appreciated. ROM limited to pain and swelling. Significant temp  increase of R knee Psychiatry: normal mood, congruent affect  Labs   I have personally reviewed the following labs and imaging studies CBC    Component Value Date/Time   WBC 10.3 01/16/2023 0424   RBC 5.11 01/16/2023 0424   HGB 15.7 01/16/2023 0424   HGB 15.6 01/19/2021 0909   HCT 45.0 01/16/2023 0424   HCT 44.5 01/19/2021 0909   PLT 286 01/16/2023 0424   PLT 240 01/19/2021 0909   MCV 88.1 01/16/2023 0424   MCV 89 01/19/2021 0909   MCH 30.7 01/16/2023 0424   MCHC 34.9 01/16/2023 0424   RDW 13.1 01/16/2023 0424   RDW 12.6 01/19/2021 0909   LYMPHSABS 1.4 01/11/2023 1855   LYMPHSABS 1.5 01/19/2021 0909   MONOABS 1.3 (H) 01/11/2023 1855   EOSABS 0.0 01/11/2023 1855   EOSABS 0.6 (H) 01/19/2021 0909   BASOSABS 0.0 01/11/2023 1855   BASOSABS 0.1 01/19/2021 0909      Latest Ref Rng & Units 01/17/2023    2:57 AM 01/16/2023    4:24 AM 01/15/2023    5:45 PM  BMP  Glucose 70 - 99 mg/dL  409  82   BUN 8 - 23 mg/dL  19  14   Creatinine 8.11 - 1.24 mg/dL 9.14  7.82  9.56   Sodium 135 - 145 mmol/L  136  131   Potassium 3.5 - 5.1 mmol/L  4.2  3.5   Chloride 98 - 111 mmol/L  102  97   CO2 22 - 32 mmol/L  26  22   Calcium 8.9 - 10.3 mg/dL  8.4  7.6     No results found.  Disposition Plan & Communication  Patient status: Inpatient  Admitted From: Home Planned disposition location: Home Anticipated discharge date: 9/17 pending clinical improvement  Family Communication: none at bedside    Author: Leeroy Bock, DO [facility] 01/17/2023, 7:10 AM   Available by Epic secure chat 7AM-7PM. If 7PM-7AM, please contact night-coverage.  [facility] contact information found on [email or web address].

## 2023-01-17 NOTE — Progress Notes (Signed)
Patient introduced to role of nurse navigator. Patient denies any SDOH needs at present. Patient sees Dr. Sampson Goon as his PCP. He drives to his appointments. Pt identifies his wife as his support system. Patient is NOT currently seeing anyone for his anxiety or depression, and states he's "okay without it for now."

## 2023-01-17 NOTE — Progress Notes (Signed)
Pharmacy Antibiotic Note  Jesse Diaz is a 62 y.o. male admitted on 01/15/2023 with cellulitis. Patient presenting with worsening redness and swelling to right knee after recent outpatient treatment for acute gout flare of right knee and a course of Bactrim for possible septic arthritis. In ED, patient is afebrile with WBC 13.9. Pharmacy has been consulted for vancomycin dosing.  Plan: Adjust vancomycin 1750 mg every 24 hours Goal AUC 400-550 Estimated AUC 490.2 Used Scr 0.9, IBW, Vd 0.5 L/kg  Also on ceftriaxone 1 gram every 24 hours  Monitor renal function, clinical status, and LOT  Temp (24hrs), Avg:97.9 F (36.6 C), Min:97.5 F (36.4 C), Max:98.2 F (36.8 C)  Recent Labs  Lab 01/11/23 1855 01/15/23 1151 01/15/23 1745 01/16/23 0424 01/17/23 0257  WBC 15.6* 13.9*  --  10.3  --   CREATININE 0.74  --  0.81 1.02 0.90    Estimated Creatinine Clearance: 97.3 mL/min (by C-G formula based on SCr of 0.9 mg/dL).    No Known Allergies  Antimicrobials this admission: ceftriaxone 9/14 >>  vancomycin 9/14 >>   Dose adjustments this admission: N/A  Microbiology results: N/A  Thank you for involving pharmacy in this patient's care.    Elliot Gurney, PharmD, BCPS Clinical Pharmacist  01/17/2023 8:49 AM

## 2023-01-18 DIAGNOSIS — L03115 Cellulitis of right lower limb: Secondary | ICD-10-CM | POA: Diagnosis not present

## 2023-01-18 MED ORDER — NAPROXEN 500 MG PO TABS: 500.0000 mg | ORAL_TABLET | Freq: Two times a day (BID) | ORAL | 0 refills | Status: AC

## 2023-01-18 MED ORDER — CEPHALEXIN 500 MG PO CAPS: 500.0000 mg | ORAL_CAPSULE | Freq: Two times a day (BID) | ORAL | 0 refills | Status: AC

## 2023-01-18 NOTE — Plan of Care (Signed)
Problem: Clinical Measurements: Goal: Ability to avoid or minimize complications of infection will improve Outcome: Progressing   Problem: Skin Integrity: Goal: Skin integrity will improve Outcome: Progressing   Problem: Education: Goal: Knowledge of General Education information will improve Description: Including pain rating scale, medication(s)/side effects and non-pharmacologic comfort measures Outcome: Progressing   Problem: Health Behavior/Discharge Planning: Goal: Ability to manage health-related needs will improve Outcome: Progressing   Problem: Clinical Measurements: Goal: Ability to maintain clinical measurements within normal limits will improve Outcome: Progressing Goal: Will remain free from infection Outcome: Progressing Goal: Diagnostic test results will improve Outcome: Progressing Goal: Cardiovascular complication will be avoided Outcome: Progressing   Problem: Activity: Goal: Risk for activity intolerance will decrease Outcome: Progressing   Problem: Nutrition: Goal: Adequate nutrition will be maintained Outcome: Progressing   Problem: Coping: Goal: Level of anxiety will decrease Outcome: Progressing   Problem: Elimination: Goal: Will not experience complications related to bowel motility Outcome: Progressing Goal: Will not experience complications related to urinary retention Outcome: Progressing   Problem: Pain Managment: Goal: General experience of comfort will improve Outcome: Progressing   Problem: Safety: Goal: Ability to remain free from injury will improve Outcome: Progressing   Problem: Skin Integrity: Goal: Risk for impaired skin integrity will decrease Outcome: Progressing

## 2023-01-18 NOTE — Discharge Instructions (Signed)
Follow up with your primary doctor in about a week to evaluate your heeling. If you are still having any problems, you can follow up with ortho surgery, Dr. Hyacinth Meeker Please continue your antibiotic course until completed.

## 2023-01-18 NOTE — Progress Notes (Incomplete)
PROGRESS NOTE  Jesse Diaz    DOB: Feb 06, 1961, 62 y.o.  EPP:[id number]    Code Status: Full Code   DOA: 01/15/2023   LOS: 3   Brief hospital course  Jesse Diaz is a 62 y.o. male with a PMH significant for HTN, OSA, depression.  They presented from home to the ED on 01/15/2023 with redness, pain of right knee x several days.  He had previously been seen in ED for suspected gout but aspiration was negative for crystals (not sent for culture). Had not responded to outpatient steroid injection for suspected gout. He was also trialed on bactrim for cellulitis outpatient without improvement.   In the ED, it was found that they had normal vital signs.  Significant findings included leukocytosis of 13,900, slightly down from 15,600 about 4 days prior.  Sed rate 9.  Sodium 131 X-ray of the knee from 9/10 showed" IMPRESSION: 1. Substantial subcutaneous edema anterior to the patella and patellar tendon. 2. Mild medial compartmental articular space narrowing..  They were initially treated with vancomycin and CTX.   Patient was admitted to medicine service for further workup and management of cellulitis/septic joint as outlined in detail below.  01/18/23 -unchanged status of pain, swelling. Ortho consulted for aspiration and culture  Assessment & Plan  Principal Problem:   Cellulitis of right leg Active Problems:   Hyponatremia   Benign essential hypertension   Depression with anxiety   OSA (obstructive sleep apnea)   Right knee skin infection   Acute pain of right knee  R knee infection- significant erythema, heat, pain, and edema of R knee decreasing his ROM and weight bearing tolerance. Cellulitis of soft tissue is likely based on the xray imaging but cannot rule out septic joint. Prior aspiration showed yellow synovial fluid with 36 WBC, and no crystals. Was not sent for culture. He has remote history of bilateral knee arthroscopes but no artificial hardware.  - ortho did not perform  aspiration. - continue IV Abx- If continues to improve, can likely transition to PO and dc patient tomorrow - analgesia PRN and starting scheduled anti-inflammatory treatment.  - continue to elevate knee - PT consult after showing improvement/stabilization  Mild hyponatremia- Na+ 131> 136. resolved  HTN- well controlled -  continue home losartan/hydrochlorothiazide for now as there were no crystals on prior aspirate. Low threshold to discontinue thiazide diuretic.   OSA - CPAP QHS  Depression  anxiety- chronic and stable - denies use of duloxetine. Discontinued.   Neuropathy-  - continue home lamotrigine   Body mass index is 32.52 kg/m.  VTE ppx: lovenox  Diet:     Diet   Diet Heart Room service appropriate? Yes; Fluid consistency: Thin   Consultants: ortho  Subjective 01/18/23    Pt reports improvement in his pain and swelling. Still having "tinges" of severe pain. Denies BM since admission but feels that he needs to have one this am.    Objective   Vitals:   01/17/23 0812 01/17/23 1523 01/17/23 2010 01/18/23 0553  BP: (!) 136/93 124/74 (!) 144/72 134/81  Pulse: (!) 54 (!) 56 (!) 57 (!) 51  Resp: 18 19 19 19   Temp: (!) 97.5 F (36.4 C) 98.5 F (36.9 C) 97.6 F (36.4 C) 98.7 F (37.1 C)  TempSrc:  Oral Oral Oral  SpO2: 100% 98% 99% 97%  Weight:      Height:        Intake/Output Summary (Last 24 hours) at 01/18/2023 6063 Last data  filed at 01/17/2023 1700 Gross per 24 hour  Intake 704.43 ml  Output --  Net 704.43 ml   Filed Weights   01/15/23 2115  Weight: 97 kg     Physical Exam:  General: awake, alert, NAD HEENT: atraumatic, clear conjunctiva, anicteric sclera, MMM, hard of hearing Respiratory: normal respiratory effort. Cardiovascular: quick capillary refill Nervous: A&O x3. no gross focal neurologic deficits, normal speech Extremities: R knee edematous compared to L, improved from yesterday. Erythema resolved. Only mild joint effusion  appreciated. ROM limited to pain and swelling. Significant temp increase of R knee Psychiatry: normal mood, congruent affect  Labs   I have personally reviewed the following labs and imaging studies CBC    Component Value Date/Time   WBC 10.3 01/16/2023 0424   RBC 5.11 01/16/2023 0424   HGB 15.7 01/16/2023 0424   HGB 15.6 01/19/2021 0909   HCT 45.0 01/16/2023 0424   HCT 44.5 01/19/2021 0909   PLT 286 01/16/2023 0424   PLT 240 01/19/2021 0909   MCV 88.1 01/16/2023 0424   MCV 89 01/19/2021 0909   MCH 30.7 01/16/2023 0424   MCHC 34.9 01/16/2023 0424   RDW 13.1 01/16/2023 0424   RDW 12.6 01/19/2021 0909   LYMPHSABS 1.4 01/11/2023 1855   LYMPHSABS 1.5 01/19/2021 0909   MONOABS 1.3 (H) 01/11/2023 1855   EOSABS 0.0 01/11/2023 1855   EOSABS 0.6 (H) 01/19/2021 0909   BASOSABS 0.0 01/11/2023 1855   BASOSABS 0.1 01/19/2021 0909      Latest Ref Rng & Units 01/17/2023    2:57 AM 01/16/2023    4:24 AM 01/15/2023    5:45 PM  BMP  Glucose 70 - 99 mg/dL  086  82   BUN 8 - 23 mg/dL  19  14   Creatinine 5.78 - 1.24 mg/dL 4.69  6.29  5.28   Sodium 135 - 145 mmol/L  136  131   Potassium 3.5 - 5.1 mmol/L  4.2  3.5   Chloride 98 - 111 mmol/L  102  97   CO2 22 - 32 mmol/L  26  22   Calcium 8.9 - 10.3 mg/dL  8.4  7.6     No results found.  Disposition Plan & Communication  Patient status: Inpatient  Admitted From: Home Planned disposition location: Home Anticipated discharge date: 9/17 pending clinical improvement  Family Communication: none at bedside    Author: Leeroy Bock, DO [facility] 01/18/2023, 7:11 AM   Available by Epic secure chat 7AM-7PM. If 7PM-7AM, please contact night-coverage.  [facility] contact information found on [email or web address].

## 2023-01-18 NOTE — Evaluation (Signed)
Physical Therapy Evaluation Patient Details Name: Jesse Diaz MRN: [id number] DOB: 16-May-1960 Today's Date: 01/18/2023  History of Present Illness  Patient is a 62 y.o. male with medical history significant for HTN, OSA, depression, who presents to the ED with persistent pain and redness of the right knee not responding to outpatient treatment.  Patient was previously treated empirically in the outpatient setting for gout of the knee with steroids but failed to improve.  He returns to the ED with continued pain redness and swelling.  He denies fever or chills.  He denies prior injury to the area or overuse. Current MD assessment includes: Cellulitis of right leg, and hyponatremia.  Clinical Impression  Pt was pleasant and motivated to participate during the session and put forth good effort throughout. Pt is currently Independent with transfers and ambulation, able to walk 200 feet with no AD, able to perform stair navigation without the use of rails with a step to pattern, needing minimal cues initially for sequencing, but Ind with second trial. Pt showing no imbalance during ambulation. Pt has no need for PT services at this time and has all DME needs met. Will complete PT orders at this time but will reassess pt pending a change in status upon receipt of new PT orders.          If plan is discharge home, recommend the following: Assist for transportation   Can travel by private vehicle        Equipment Recommendations None recommended by PT  Recommendations for Other Services       Functional Status Assessment Patient has not had a recent decline in their functional status     Precautions / Restrictions Precautions Precautions: Fall Restrictions Weight Bearing Restrictions: No      Mobility  Bed Mobility               General bed mobility comments: Pt seated EOB upon entry    Transfers Overall transfer level: Independent Equipment used: None                     Ambulation/Gait Ambulation/Gait assistance: Independent Gait Distance (Feet): 200 Feet Assistive device: None Gait Pattern/deviations: WFL(Within Functional Limits) Gait velocity: normal     General Gait Details: Pt ambulated to stairs and back, taking steady steps with very mild R sided limp. No LOB seen, pt reports his walking feels normal except for the pain in his R knee  Stairs Stairs: Yes Stairs assistance: Independent Stair Management: No rails Number of Stairs: 4 General stair comments: Pt utilizing step-to step strategy, trials stairs x2 with pt showing increased confidence with second trial. minimal cues provided for sequencing initially, Ind with second trial.  Wheelchair Mobility     Tilt Bed    Modified Rankin (Stroke Patients Only)       Balance Overall balance assessment: No apparent balance deficits (not formally assessed)                                           Pertinent Vitals/Pain Pain Assessment Pain Assessment: 0-10 Pain Score: 5  Pain Location: R knee Pain Intervention(s): Monitored during session    Home Living Family/patient expects to be discharged to:: Private residence Living Arrangements: Spouse/significant other;Children Available Help at Discharge: Family;Available 24 hours/day Type of Home: House Home Access: Stairs to enter Entrance Stairs-Rails: None Entrance [employer]  of Steps: 3   Home Layout: Able to live on main level with bedroom/bathroom;Multi-level Home Equipment: None      Prior Function Prior Level of Function : Independent/Modified Independent;Driving             Mobility Comments: Ind, driving ADLs Comments: Ind     Extremity/Trunk Assessment   Upper Extremity Assessment Upper Extremity Assessment: Overall WFL for tasks assessed    Lower Extremity Assessment Lower Extremity Assessment: Overall WFL for tasks assessed       Communication   Communication Communication:  No apparent difficulties  Cognition Arousal: Alert Behavior During Therapy: WFL for tasks assessed/performed                                            General Comments      Exercises Other Exercises Other Exercises: pt educated on slow increase of activity with RLE in order to promote appropriate healing and safety   Assessment/Plan    PT Assessment Patient does not need any further PT services  PT Problem List         PT Treatment Interventions      PT Goals (Current goals can be found in the Care Plan section)  Acute Rehab PT Goals Patient Stated Goal: get good enough to use tractor again PT Goal Formulation: With patient Time For Goal Achievement: 01/31/23 Potential to Achieve Goals: Good    Frequency       Co-evaluation               AM-PAC PT "6 Clicks" Mobility  Outcome Measure Help needed turning from your back to your side while in a flat bed without using bedrails?: None Help needed moving from lying on your back to sitting on the side of a flat bed without using bedrails?: None Help needed moving to and from a bed to a chair (including a wheelchair)?: None Help needed standing up from a chair using your arms (e.g., wheelchair or bedside chair)?: None Help needed to walk in hospital room?: None Help needed climbing 3-5 steps with a railing? : None 6 Click Score: 24    End of Session Equipment Utilized During Treatment: Gait belt Activity Tolerance: Patient tolerated treatment well Patient left: in chair;with call bell/phone within reach Nurse Communication: Mobility status PT Visit Diagnosis: Other abnormalities of gait and mobility (R26.89);Pain Pain - Right/Left: Right Pain - part of body: Knee    Time: 5284-1324 PT Time Calculation (min) (ACUTE ONLY): 14 min   Charges:                 Cecile Sheerer, SPT 01/18/23, 1:38 PM

## 2023-01-18 NOTE — Discharge Summary (Signed)
Physician Discharge Summary  Patient: Jesse Diaz BJY:[id number] DOB: 04-28-1961   Code Status: Full Code Admit date: 01/15/2023 Discharge date: 01/18/2023 Disposition: Home, No home health services recommended PCP: Patient, No Pcp Per  Recommendations for Outpatient Follow-up:  Follow up with PCP within 1-2 weeks Regarding general hospital follow up and preventative care Recommend    Discharge Diagnoses:  Principal Problem:   Cellulitis of right leg Active Problems:   Hyponatremia   Benign essential hypertension   Depression with anxiety   OSA (obstructive sleep apnea)   Right knee skin infection   Acute pain of right knee  Brief Hospital Course Summary: Jesse Diaz is a 62 y.o. male with a PMH significant for HTN, OSA, depression.   They presented from home to the ED on 01/15/2023 with redness, pain of right knee x several days.  He had previously been seen in ED for suspected gout but aspiration was negative for crystals (not sent for culture). Had not responded to outpatient steroid injection for suspected gout. He was also trialed on bactrim for cellulitis outpatient without improvement.    In the ED, it was found that they had normal vital signs.  Significant findings included leukocytosis of 13,900, slightly down from 15,600 about 4 days prior.  Sed rate 9.  Sodium 131 X-ray of the knee from 9/10 showed" IMPRESSION: 1. Substantial subcutaneous edema anterior to the patella and patellar tendon. 2. Mild medial compartmental articular space narrowing..   They were initially treated with vancomycin and CTX.    Patient was admitted to medicine service for further workup and management of cellulitis/septic joint as outlined in detail below.   01/18/23 -unchanged status of pain, swelling. Ortho consulted for aspiration and culture  Discharge Condition: {DISCHARGE CONDITION:19696}, improved Recommended discharge diet: {Discharge  YQMV:[id number]}  Consultations: ***  Procedures/Studies: ***   Allergies as of 01/18/2023   No Known Allergies      Medication List     STOP taking these medications    Biotin 28413 MCG Tabs   predniSONE 20 MG tablet Commonly known as: DELTASONE   sulfamethoxazole-trimethoprim 800-160 MG tablet Commonly known as: BACTRIM DS       TAKE these medications    albuterol 108 (90 Base) MCG/ACT inhaler Commonly known as: VENTOLIN HFA TAKE 2 PUFFS BY MOUTH EVERY 6 HOURS AS NEEDED FOR WHEEZE OR SHORTNESS OF BREATH   CALCIUM CITRATE PO Take 750 mg by mouth daily.   cephALEXin 500 MG capsule Commonly known as: KEFLEX Take 1 capsule (500 mg total) by mouth 2 (two) times daily for 5 days.   co-enzyme Q-10 30 MG capsule Take 30 mg by mouth daily.   HYDROcodone-acetaminophen 5-325 MG tablet Commonly known as: Norco Take 1 tablet by mouth every 6 (six) hours as needed for moderate pain.   lamoTRIgine 100 MG tablet Commonly known as: LAMICTAL Take 100 mg by mouth 2 (two) times daily.   losartan-hydrochlorothiazide 100-12.5 MG tablet Commonly known as: HYZAAR Take 1 tablet by mouth daily.   Magnesium 250 MG Tabs Take 250 mg by mouth 2 (two) times daily.   naproxen 500 MG tablet Commonly known as: NAPROSYN Take 1 tablet (500 mg total) by mouth 2 (two) times daily with a meal for 7 days.   Potassium 99 MG Tabs Take 99 mg by mouth daily.   TURMERIC PO Take by mouth.   UNABLE TO FIND Super Oxicell        Follow-up Information  Deeann Saint, MD. Go on 01/19/2023.   Specialty: Orthopedic Surgery Why: @ 11 am Contact information: [address] [place] [address] [phone]         PCP. Schedule an appointment as soon as possible for a visit in 1 week(s).   Why: Patient sees Dr. Sampson Goon.                Subjective   Pt reports ***  All questions and concerns were addressed at time of discharge.  Objective  Blood  pressure (!) 152/93, pulse (!) 51, temperature 98.2 F (36.8 C), resp. rate 15, height 5\' 8"  (1.727 m), weight 97 kg, SpO2 98%.   General: Pt is alert, awake, not in acute distress Cardiovascular: RRR, S1/S2 +, no rubs, no gallops Respiratory: CTA bilaterally, no wheezing, no rhonchi Abdominal: Soft, NT, ND, bowel sounds + Extremities: no edema, no cyanosis  The results of significant diagnostics from this hospitalization (including imaging, microbiology, ancillary and laboratory) are listed below for reference.   Imaging studies: DG Knee Complete 4 Views Right  Result Date: 01/11/2023 CLINICAL DATA:  Right knee pain. EXAM: RIGHT KNEE - COMPLETE 4+ VIEW COMPARISON:  None Available. FINDINGS: Mild medial compartmental articular space narrowing. Mild marginal spurring of the patella. No knee effusion. Substantial subcutaneous edema anterior to the patella patellar tendon. IMPRESSION: 1. Substantial subcutaneous edema anterior to the patella and patellar tendon. 2. Mild medial compartmental articular space narrowing. Electronically Signed   By: Gaylyn Rong M.D.   On: 01/11/2023 19:42    Labs: Basic Metabolic Panel: Recent Labs  Lab 01/11/23 1855 01/15/23 1745 01/16/23 0424 01/17/23 0257  NA 139 131* 136  --   K 3.9 3.5 4.2  --   CL 104 97* 102  --   CO2 23 22 26   --   GLUCOSE 118* 82 101*  --   BUN 26* 14 19  --   CREATININE 0.74 0.81 1.02 0.90  CALCIUM 8.8* 7.6* 8.4*  --    CBC: Recent Labs  Lab 01/11/23 1855 01/15/23 1151 01/16/23 0424  WBC 15.6* 13.9* 10.3  NEUTROABS 12.7*  --   --   HGB 14.2 16.1 15.7  HCT 40.9 44.9 45.0  MCV 89.3 87.0 88.1  PLT 307 349 286   Microbiology: ***  Time coordinating discharge: Over 30 minutes  Leeroy Bock, MD  [facility] 01/18/2023, 11:41 AM

## 2023-01-24 ENCOUNTER — Telehealth: Payer: Self-pay

## 2023-02-04 ENCOUNTER — Telehealth: Payer: Self-pay

## 2023-02-06 DIAGNOSIS — G894 Chronic pain syndrome: Secondary | ICD-10-CM | POA: Insufficient documentation

## 2023-02-06 DIAGNOSIS — Z789 Other specified health status: Secondary | ICD-10-CM | POA: Insufficient documentation

## 2023-02-06 DIAGNOSIS — Z79899 Other long term (current) drug therapy: Secondary | ICD-10-CM | POA: Insufficient documentation

## 2023-02-06 DIAGNOSIS — M899 Disorder of bone, unspecified: Secondary | ICD-10-CM | POA: Insufficient documentation

## 2023-02-06 NOTE — Patient Instructions (Signed)

## 2023-02-06 NOTE — Progress Notes (Unsigned)
Patient: Jesse Diaz  Service Category: E/M  Provider: Oswaldo Done, MD  DOB: Jan 28, 1961  DOS: 02/07/2023  Referring Provider: Lonell Face, MD  MRN: [id number]  Setting: Ambulatory outpatient  PCP: Patient, No Pcp Per  Type: New Patient  Specialty: Interventional Pain Management    Location: Office  Delivery: Face-to-face     Primary Reason(s) for Visit: Encounter for initial evaluation of one or more chronic problems (new to examiner) potentially causing chronic pain, and posing a threat to normal musculoskeletal function. (Level of risk: High) CC: No chief complaint on file.  HPI  Jesse Diaz is a 62 y.o. year old, male patient, who comes for the first time to our practice referred by Lonell Face, MD for our initial evaluation of his chronic pain. He has Benign essential hypertension; Depression with anxiety; History of gastroesophageal reflux (GERD); Peripheral neuropathy; Bilateral carpal tunnel syndrome; OSA (obstructive sleep apnea); Cellulitis of right leg; Hyponatremia; Right knee skin infection; Acute pain of right knee; Chronic pain syndrome; Pharmacologic therapy; Disorder of skeletal system; and Problems influencing health status on their problem list. Today he comes in for evaluation of his No chief complaint on file.  Pain Assessment: Location:     Radiating:   Onset:   Duration:   Quality:   Severity:  /10 (subjective, self-reported pain score)  Effect on ADL:   Timing:   Modifying factors:   BP:    HR:    Onset and Duration: {Hx; Onset and Duration:210120511} Cause of pain: {Hx; Cause:210120521} Severity: {Pain Severity:210120502} Timing: {Symptoms; Timing:210120501} Aggravating Factors: {Causes; Aggravating pain factors:210120507} Alleviating Factors: {Causes; Alleviating Factors:210120500} Associated Problems: {Hx; Associated problems:210120515} Quality of Pain: {Hx; Symptom quality or Descriptor:210120531} Previous Examinations or Tests: {Hx; Previous  examinations or test:210120529} Previous Treatments: {Hx; Previous Treatment:210120503}  Jesse Diaz is being evaluated for possible interventional pain management therapies for the treatment of his chronic pain.   ***  Mr. Ksiazek has been informed that this initial visit was an evaluation only.  On the follow up appointment I will go over the results, including ordered tests and available interventional therapies. At that time he will have the opportunity to decide whether to proceed with offered therapies or not. In the event that Mr. Adorno prefers avoiding interventional options, this will conclude our involvement in the case.  Medication management recommendations may be provided upon request.  Patient informed that diagnostic tests may be ordered to assist in identifying underlying causes, narrow the list of differential diagnoses and aid in determining candidacy for (or contraindications to) planned therapeutic interventions.  Historic Controlled Substance Pharmacotherapy Review  PMP and historical list of controlled substances: ***  Most recently prescribed opioid analgesics:   *** MME/day: *** mg/day  Historical Monitoring: The patient  reports no history of drug use. List of prior UDS Testing: No results found for: "MDMA", "COCAINSCRNUR", "PCPSCRNUR", "PCPQUANT", "CANNABQUANT", "THCU", "ETH", "CBDTHCR", "D8THCCBX", "D9THCCBX" Historical Background Evaluation: [place] PMP: PDMP reviewed during this encounter. Review of the past 74-months conducted.             PMP NARX Score Report:  Narcotic: *** Sedative: *** Stimulant: *** [place] Department of public safety, offender search: Engineer, mining Information) Non-contributory Risk Assessment Profile: Aberrant behavior: None observed or detected today Risk factors for fatal opioid overdose: None identified today PMP NARX Overdose Risk Score: *** Fatal overdose hazard ratio (HR): Calculation deferred Non-fatal overdose hazard ratio (HR): Calculation  deferred Risk of opioid abuse or dependence: 0.7-3.0% with doses <= 36  MME/day and 6.1-26% with doses >= 120 MME/day. Substance use disorder (SUD) risk level: See below Personal History of Substance Abuse (SUD-Substance use disorder):  Alcohol:    Illegal Drugs:    Rx Drugs:    ORT Risk Level calculation:    ORT Scoring interpretation table:  Score <3 = Low Risk for SUD  Score between 4-7 = Moderate Risk for SUD  Score >8 = High Risk for Opioid Abuse   PHQ-2 Depression Scale:  Total score:    PHQ-2 Scoring interpretation table: (Score and probability of major depressive disorder)  Score 0 = No depression  Score 1 = 15.4% Probability  Score 2 = 21.1% Probability  Score 3 = 38.4% Probability  Score 4 = 45.5% Probability  Score 5 = 56.4% Probability  Score 6 = 78.6% Probability   PHQ-9 Depression Scale:  Total score:    PHQ-9 Scoring interpretation table:  Score 0-4 = No depression  Score 5-9 = Mild depression  Score 10-14 = Moderate depression  Score 15-19 = Moderately severe depression  Score 20-27 = Severe depression (2.4 times higher risk of SUD and 2.89 times higher risk of overuse)   Pharmacologic Plan: As per protocol, I have not taken over any controlled substance management, pending the results of ordered tests and/or consults.            Initial impression: Pending review of available data and ordered tests.  Meds   Current Outpatient Medications:    albuterol (VENTOLIN HFA) 108 (90 Base) MCG/ACT inhaler, TAKE 2 PUFFS BY MOUTH EVERY 6 HOURS AS NEEDED FOR WHEEZE OR SHORTNESS OF BREATH, Disp: 18 g, Rfl: 0   CALCIUM CITRATE PO, Take 750 mg by mouth daily., Disp: , Rfl:    co-enzyme Q-10 30 MG capsule, Take 30 mg by mouth daily., Disp: , Rfl:    HYDROcodone-acetaminophen (NORCO) 5-325 MG tablet, Take 1 tablet by mouth every 6 (six) hours as needed for moderate pain., Disp: 10 tablet, Rfl: 0   lamoTRIgine (LAMICTAL) 100 MG tablet, Take 100 mg by mouth 2 (two) times  daily., Disp: , Rfl:    losartan-hydrochlorothiazide (HYZAAR) 100-12.5 MG tablet, Take 1 tablet by mouth daily., Disp: , Rfl:    Magnesium 250 MG TABS, Take 250 mg by mouth 2 (two) times daily., Disp: , Rfl:    Potassium 99 MG TABS, Take 99 mg by mouth daily., Disp: , Rfl:    TURMERIC PO, Take by mouth., Disp: , Rfl:    UNABLE TO FIND, Super Oxicell, Disp: , Rfl:   Imaging Review  Cervical Imaging: Cervical MR wo contrast: No results found for this or any previous visit.  Cervical MR wo contrast: No valid procedures specified. Cervical MR w/wo contrast: No results found for this or any previous visit.  Cervical MR w contrast: No results found for this or any previous visit.  Cervical CT wo contrast: No results found for this or any previous visit.  Cervical CT w/wo contrast: No results found for this or any previous visit.  Cervical CT w/wo contrast: No results found for this or any previous visit.  Cervical CT w contrast: No results found for this or any previous visit.  Cervical CT outside: No results found for this or any previous visit.  Cervical DG 1 view: No results found for this or any previous visit.  Cervical DG 2-3 views: No results found for this or any previous visit.  Cervical DG F/E views: No results found for this or any  previous visit.  Cervical DG 2-3 clearing views: No results found for this or any previous visit.  Cervical DG Bending/F/E views: No results found for this or any previous visit.  Cervical DG complete: No results found for this or any previous visit.  Cervical DG Myelogram views: No results found for this or any previous visit.  Cervical DG Myelogram views: No results found for this or any previous visit.  Cervical Discogram views: No results found for this or any previous visit.   Shoulder Imaging: Shoulder-R MR w contrast: No results found for this or any previous visit.  Shoulder-L MR w contrast: No results found for this or any  previous visit.  Shoulder-R MR w/wo contrast: No results found for this or any previous visit.  Shoulder-L MR w/wo contrast: No results found for this or any previous visit.  Shoulder-R MR wo contrast: No results found for this or any previous visit.  Shoulder-L MR wo contrast: No results found for this or any previous visit.  Shoulder-R CT w contrast: No results found for this or any previous visit.  Shoulder-L CT w contrast: No results found for this or any previous visit.  Shoulder-R CT w/wo contrast: No results found for this or any previous visit.  Shoulder-L CT w/wo contrast: No results found for this or any previous visit.  Shoulder-R CT wo contrast: No results found for this or any previous visit.  Shoulder-L CT wo contrast: No results found for this or any previous visit.  Shoulder-R DG Arthrogram: No results found for this or any previous visit.  Shoulder-L DG Arthrogram: No results found for this or any previous visit.  Shoulder-R DG 1 view: No results found for this or any previous visit.  Shoulder-L DG 1 view: No results found for this or any previous visit.  Shoulder-R DG: No results found for this or any previous visit.  Shoulder-L DG: No results found for this or any previous visit.   Thoracic Imaging: Thoracic MR wo contrast: No results found for this or any previous visit.  Thoracic MR wo contrast: No valid procedures specified. Thoracic MR w/wo contrast: No results found for this or any previous visit.  Thoracic MR w contrast: No results found for this or any previous visit.  Thoracic CT wo contrast: No results found for this or any previous visit.  Thoracic CT w/wo contrast: No results found for this or any previous visit.  Thoracic CT w/wo contrast: No results found for this or any previous visit.  Thoracic CT w contrast: No results found for this or any previous visit.  Thoracic DG 2-3 views: No results found for this or any previous  visit.  Thoracic DG 4 views: No results found for this or any previous visit.  Thoracic DG: No results found for this or any previous visit.  Thoracic DG w/swimmers view: No results found for this or any previous visit.  Thoracic DG Myelogram views: No results found for this or any previous visit.  Thoracic DG Myelogram views: No results found for this or any previous visit.   Lumbosacral Imaging: Lumbar MR wo contrast: No results found for this or any previous visit.  Lumbar MR wo contrast: No valid procedures specified. Lumbar MR w/wo contrast: No results found for this or any previous visit.  Lumbar MR w/wo contrast: No results found for this or any previous visit.  Lumbar MR w contrast: No results found for this or any previous visit.  Lumbar CT wo contrast: No  results found for this or any previous visit.  Lumbar CT w/wo contrast: No results found for this or any previous visit.  Lumbar CT w/wo contrast: No results found for this or any previous visit.  Lumbar CT w contrast: No results found for this or any previous visit.  Lumbar DG 1V: No results found for this or any previous visit.  Lumbar DG 1V (Clearing): No results found for this or any previous visit.  Lumbar DG 2-3V (Clearing): No results found for this or any previous visit.  Lumbar DG 2-3 views: No results found for this or any previous visit.  Lumbar DG (Complete) 4+V: No results found for this or any previous visit.        Lumbar DG F/E views: No results found for this or any previous visit.        Lumbar DG Bending views: No results found for this or any previous visit.        Lumbar DG Myelogram views: No results found for this or any previous visit.  Lumbar DG Myelogram: No results found for this or any previous visit.  Lumbar DG Myelogram: No results found for this or any previous visit.  Lumbar DG Myelogram: No results found for this or any previous visit.  Lumbar DG Myelogram Lumbosacral:  No results found for this or any previous visit.  Lumbar DG Diskogram views: No results found for this or any previous visit.  Lumbar DG Diskogram views: No results found for this or any previous visit.  Lumbar DG Epidurogram OP: No results found for this or any previous visit.  Lumbar DG Epidurogram IP: No valid procedures specified.  Sacroiliac Joint Imaging: Sacroiliac Joint DG: No results found for this or any previous visit.  Sacroiliac Joint MR w/wo contrast: No results found for this or any previous visit.  Sacroiliac Joint MR wo contrast: No results found for this or any previous visit.   Spine Imaging: Whole Spine DG Myelogram views: No results found for this or any previous visit.  Whole Spine MR Mets screen: No results found for this or any previous visit.  Whole Spine MR Mets screen: No results found for this or any previous visit.  Whole Spine MR w/wo: No results found for this or any previous visit.  MRA Spinal Canal w/ cm: No results found for this or any previous visit.  MRA Spinal Canal wo/ cm: No valid procedures specified. MRA Spinal Canal w/wo cm: No results found for this or any previous visit.  Spine Outside MR Films: No results found for this or any previous visit.  Spine Outside CT Films: No results found for this or any previous visit.  CT-Guided Biopsy: No results found for this or any previous visit.  CT-Guided Needle Placement: No results found for this or any previous visit.  DG Spine outside: No results found for this or any previous visit.  IR Spine outside: No results found for this or any previous visit.  NM Spine outside: No results found for this or any previous visit.   Hip Imaging: Hip-R MR w contrast: No results found for this or any previous visit.  Hip-L MR w contrast: No results found for this or any previous visit.  Hip-R MR w/wo contrast: No results found for this or any previous visit.  Hip-L MR w/wo contrast: No  results found for this or any previous visit.  Hip-R MR wo contrast: No results found for this or any previous visit.  Hip-L MR  wo contrast: No results found for this or any previous visit.  Hip-R CT w contrast: No results found for this or any previous visit.  Hip-L CT w contrast: No results found for this or any previous visit.  Hip-R CT w/wo contrast: No results found for this or any previous visit.  Hip-L CT w/wo contrast: No results found for this or any previous visit.  Hip-R CT wo contrast: No results found for this or any previous visit.  Hip-L CT wo contrast: No results found for this or any previous visit.  Hip-R DG 2-3 views: No results found for this or any previous visit.  Hip-L DG 2-3 views: No results found for this or any previous visit.  Hip-R DG Arthrogram: No results found for this or any previous visit.  Hip-L DG Arthrogram: No results found for this or any previous visit.  Hip-B DG Bilateral: No results found for this or any previous visit.  Hip-B DG Bilateral (5V): No results found for this or any previous visit.   Knee Imaging: Knee-R MR w contrast: No results found for this or any previous visit.  Knee-L MR w/o contrast: No results found for this or any previous visit.  Knee-R MR w/wo contrast: No results found for this or any previous visit.  Knee-L MR w/wo contrast: No results found for this or any previous visit.  Knee-R MR wo contrast: No results found for this or any previous visit.  Knee-L MR wo contrast: No results found for this or any previous visit.  Knee-R CT w contrast: No results found for this or any previous visit.  Knee-L CT w contrast: No results found for this or any previous visit.  Knee-R CT w/wo contrast: No results found for this or any previous visit.  Knee-L CT w/wo contrast: No results found for this or any previous visit.  Knee-R CT wo contrast: No results found for this or any previous visit.  Knee-L CT wo  contrast: No results found for this or any previous visit.  Knee-R DG 1-2 views: No results found for this or any previous visit.  Knee-L DG 1-2 views: No results found for this or any previous visit.  Knee-R DG 3 views: No results found for this or any previous visit.  Knee-L DG 3 views: No results found for this or any previous visit.  Knee-R DG 4 views: Results for orders placed during the hospital encounter of 01/11/23  DG Knee Complete 4 Views Right  Narrative CLINICAL DATA:  Right knee pain.  EXAM: RIGHT KNEE - COMPLETE 4+ VIEW  COMPARISON:  None Available.  FINDINGS: Mild medial compartmental articular space narrowing. Mild marginal spurring of the patella.  No knee effusion. Substantial subcutaneous edema anterior to the patella patellar tendon.  IMPRESSION: 1. Substantial subcutaneous edema anterior to the patella and patellar tendon. 2. Mild medial compartmental articular space narrowing.   Electronically Signed By: Gaylyn Rong M.D. On: 01/11/2023 19:42  Knee-L DG 4 views: No results found for this or any previous visit.  Knee-R DG Arthrogram: No results found for this or any previous visit.  Knee-L DG Arthrogram: No results found for this or any previous visit.   Ankle Imaging: Ankle-R DG Complete: No results found for this or any previous visit.  Ankle-L DG Complete: No results found for this or any previous visit.   Foot Imaging: Foot-R DG Complete: No results found for this or any previous visit.  Foot-L DG Complete: No results found for this  or any previous visit.   Elbow Imaging: Elbow-R DG Complete: No results found for this or any previous visit.  Elbow-L DG Complete: No results found for this or any previous visit.   Wrist Imaging: Wrist-R DG Complete: No results found for this or any previous visit.  Wrist-L DG Complete: No results found for this or any previous visit.   Hand Imaging: Hand-R DG Complete: No results  found for this or any previous visit.  Hand-L DG Complete: No results found for this or any previous visit.   Complexity Note: Imaging results reviewed.                         ROS  Cardiovascular: {Hx; Cardiovascular History:210120525} Pulmonary or Respiratory: {Hx; Pumonary and/or Respiratory History:210120523} Neurological: {Hx; Neurological:210120504} Psychological-Psychiatric: {Hx; Psychological-Psychiatric History:210120512} Gastrointestinal: {Hx; Gastrointestinal:210120527} Genitourinary: {Hx; Genitourinary:210120506} Hematological: {Hx; Hematological:210120510} Endocrine: {Hx; Endocrine history:210120509} Rheumatologic: {Hx; Rheumatological:210120530} Musculoskeletal: {Hx; Musculoskeletal:210120528} Work History: {Hx; Work history:210120514}  Allergies  Mr. Defino has No Known Allergies.  Laboratory Chemistry Profile   Renal Lab Results  Component Value Date   BUN 19 01/16/2023   CREATININE 0.90 01/17/2023   BCR 16 01/19/2021   GFRAA 110 06/09/2020   GFRNONAA >60 01/17/2023   SPECGRAV 1.025 01/19/2021   PHUR 7.0 01/19/2021   PROTEINUR Trace (A) 01/19/2021     Electrolytes Lab Results  Component Value Date   NA 136 01/16/2023   K 4.2 01/16/2023   CL 102 01/16/2023   CALCIUM 8.4 (L) 01/16/2023     Hepatic Lab Results  Component Value Date   AST 19 01/11/2023   ALT 31 01/11/2023   ALBUMIN 3.4 (L) 01/11/2023   ALKPHOS 54 01/11/2023     ID Lab Results  Component Value Date   HIV Non Reactive 01/16/2023     Bone No results found for: "VD25OH", "VD125OH2TOT", "ZO1096EA5", "WU9811BJ4", "25OHVITD1", "25OHVITD2", "25OHVITD3", "TESTOFREE", "TESTOSTERONE"   Endocrine Lab Results  Component Value Date   GLUCOSE 101 (H) 01/16/2023   GLUCOSEU Negative 01/19/2021   HGBA1C 5.6 03/02/2018   TSH 1.390 01/19/2021     Neuropathy Lab Results  Component Value Date   HGBA1C 5.6 03/02/2018   HIV Non Reactive 01/16/2023     CNS No results found for:  "COLORCSF", "APPEARCSF", "RBCCOUNTCSF", "WBCCSF", "POLYSCSF", "LYMPHSCSF", "EOSCSF", "PROTEINCSF", "GLUCCSF", "JCVIRUS", "CSFOLI", "IGGCSF", "LABACHR", "ACETBL"   Inflammation (CRP: Acute  ESR: Chronic) Lab Results  Component Value Date   CRP 1.2 (H) 01/15/2023   ESRSEDRATE 9 01/15/2023     Rheumatology Lab Results  Component Value Date   LABURIC 4.1 01/11/2023     Coagulation Lab Results  Component Value Date   PLT 286 01/16/2023     Cardiovascular Lab Results  Component Value Date   HGB 15.7 01/16/2023   HCT 45.0 01/16/2023     Screening Lab Results  Component Value Date   HIV Non Reactive 01/16/2023     Cancer No results found for: "CEA", "CA125", "LABCA2"   Allergens No results found for: "ALMOND", "APPLE", "ASPARAGUS", "AVOCADO", "BANANA", "BARLEY", "BASIL", "BAYLEAF", "GREENBEAN", "LIMABEAN", "WHITEBEAN", "BEEFIGE", "REDBEET", "BLUEBERRY", "BROCCOLI", "CABBAGE", "MELON", "CARROT", "CASEIN", "CASHEWNUT", "CAULIFLOWER", "CELERY"     Note: Lab results reviewed.  PFSH  Drug: Mr. Jablonsky  reports no history of drug use. Alcohol:  reports no history of alcohol use. Tobacco:  reports that he has never smoked. He quit smokeless tobacco use about 16 years ago. Medical:  has a past medical history of Anxiety, Asthma, Depression, GERD (  gastroesophageal reflux disease), Hypertension, Sinus drainage, and Wears dentures. Family: family history includes Cancer in his father, maternal aunt, and maternal uncle; Colon polyps in his mother and sister; Diabetes in his maternal aunt and maternal uncle; Heart attack in his father; Hypertension in his maternal aunt, maternal uncle, and mother; Leukemia in his father; Stroke in his father and mother.  Past Surgical History:  Procedure Laterality Date   CARDIOVASCULAR STRESS TEST     approx 8 yrs ago - anxiety/reflux   COLONOSCOPY     COLONOSCOPY WITH PROPOFOL N/A 04/02/2016   Procedure: COLONOSCOPY WITH PROPOFOL;  Surgeon: Scot Jun, MD;  Location: [facility] ENDOSCOPY;  Service: Endoscopy;  Laterality: N/A;   ESOPHAGEAL DILATION     striction   ESOPHAGOGASTRODUODENOSCOPY (EGD) WITH PROPOFOL N/A 04/02/2016   Procedure: ESOPHAGOGASTRODUODENOSCOPY (EGD) WITH PROPOFOL;  Surgeon: Scot Jun, MD;  Location: [facility] ENDOSCOPY;  Service: Endoscopy;  Laterality: N/A;   EXTRACORPOREAL SHOCK WAVE LITHOTRIPSY Right 12/25/2015   Procedure: EXTRACORPOREAL SHOCK WAVE LITHOTRIPSY (ESWL);  Surgeon: Orson Ape, MD;  Location: [facility] ORS;  Service: Urology;  Laterality: Right;   KNEE ARTHROSCOPY Bilateral    KNEE ARTHROSCOPY     SEPTOPLASTY N/A 04/04/2015   Procedure: SEPTOPLASTY;  Surgeon: Linus Salmons, MD;  Location: [facility]ERY [facility];  Service: ENT;  Laterality: N/A;   TONSILLECTOMY     TURBINATE REDUCTION N/A 04/04/2015   Procedure: TURBINATE REDUCTION SMR;  Surgeon: Linus Salmons, MD;  Location: [facility];  Service: ENT;  Laterality: N/A;   UPPER GI ENDOSCOPY     Active Ambulatory Problems    Diagnosis Date Noted   Benign essential hypertension 03/07/2014   Depression with anxiety 11/21/2017   History of gastroesophageal reflux (GERD) 11/21/2017   Peripheral neuropathy 03/02/2018   Bilateral carpal tunnel syndrome 02/16/2019   OSA (obstructive sleep apnea) 07/07/2020   Cellulitis of right leg 01/15/2023   Hyponatremia 01/16/2023   Right knee skin infection 01/16/2023   Acute pain of right knee 01/17/2023   Chronic pain syndrome 02/06/2023   Pharmacologic therapy 02/06/2023   Disorder of skeletal system 02/06/2023   Problems influencing health status 02/06/2023   Resolved Ambulatory Problems    Diagnosis Date Noted   No Resolved Ambulatory Problems   Past Medical History:  Diagnosis Date   Anxiety    Asthma    Depression    GERD (gastroesophageal reflux disease)    Hypertension    Sinus drainage    Wears dentures    Constitutional Exam  General appearance: Well nourished, well developed,  and well hydrated. In no apparent acute distress There were no vitals filed for this visit. BMI Assessment: Estimated body mass index is 32.52 kg/m as calculated from the following:   Height as of 01/15/23: 5\' 8"  (1.727 m).   Weight as of 01/15/23: 213 lb 13.5 oz (97 kg).  BMI interpretation table: BMI level Category Range association with higher incidence of chronic pain  <18 kg/m2 Underweight   18.5-24.9 kg/m2 Ideal body weight   25-29.9 kg/m2 Overweight Increased incidence by 20%  30-34.9 kg/m2 Obese (Class I) Increased incidence by 68%  35-39.9 kg/m2 Severe obesity (Class II) Increased incidence by 136%  >40 kg/m2 Extreme obesity (Class III) Increased incidence by 254%   Patient's current BMI Ideal Body weight  There is no height or weight on file to calculate BMI. Patient weight not recorded   BMI Readings from Last 4 Encounters:  01/15/23 32.52 kg/m  01/11/23 31.93 kg/m  01/19/21  32.28 kg/m  10/06/20 33.89 kg/m   Wt Readings from Last 4 Encounters:  01/15/23 213 lb 13.5 oz (97 kg)  01/11/23 210 lb (95.3 kg)  01/19/21 218 lb 9.6 oz (99.2 kg)  10/06/20 226 lb 3.2 oz (102.6 kg)    Psych/Mental status: Alert, oriented x 3 (person, place, & time)       Eyes: PERLA Respiratory: No evidence of acute respiratory distress  Assessment  Primary Diagnosis & Pertinent Problem List: The primary encounter diagnosis was Chronic pain syndrome. Diagnoses of Pharmacologic therapy, Disorder of skeletal system, and Problems influencing health status were also pertinent to this visit.  Visit Diagnosis (New problems to examiner): 1. Chronic pain syndrome   2. Pharmacologic therapy   3. Disorder of skeletal system   4. Problems influencing health status    Plan of Care (Initial workup plan)  Note: Mr. Pavlik was reminded that as per protocol, today's visit has been an evaluation only. We have not taken over the patient's controlled substance management.  Problem-specific plan: No  problem-specific Assessment & Plan notes found for this encounter.  Lab Orders  No laboratory test(s) ordered today   Imaging Orders  No imaging studies ordered today   Referral Orders  No referral(s) requested today   Procedure Orders    No procedure(s) ordered today   Pharmacotherapy (current): Medications ordered:  No orders of the defined types were placed in this encounter.  Medications administered during this visit: Mohammad L. [employer]" had no medications administered during this visit.   Analgesic Pharmacotherapy:  Opioid Analgesics: For patients currently taking or requesting to take opioid analgesics, in accordance with [facility] Guidelines, we will assess their risks and indications for the use of these substances. After completing our evaluation, we may offer recommendations, but we no longer take patients for medication management. The prescribing physician will ultimately decide, based on his/her training and level of comfort whether to adopt any of the recommendations, including whether or not to prescribe such medicines.  Membrane stabilizer: To be determined at a later time  Muscle relaxant: To be determined at a later time  NSAID: To be determined at a later time  Other analgesic(s): To be determined at a later time   Interventional management options: Mr. Fei was informed that there is no guarantee that he would be a candidate for interventional therapies. The decision will be based on the results of diagnostic studies, as well as Mr. Soffer's risk profile.  Procedure(s) under consideration:  Pending results of ordered studies      Interventional Therapies  Risk Factors  Considerations  Medical Comorbidities:     Planned  Pending:      Under consideration:   Pending   Completed:   None at this time   Therapeutic  Palliative (PRN) options:   None established   Completed by other providers:   None reported        Provider-requested follow-up: No follow-ups on file.  Future Appointments  Date Time Provider Department Center  02/07/2023  2:00 PM Delano Metz, MD [facility] None    Duration of encounter: *** minutes.  Total time on encounter, as per AMA guidelines included both the face-to-face and non-face-to-face time personally spent by the physician and/or other qualified health care professional(s) on the day of the encounter (includes time in activities that require the physician or other qualified health care professional and does not include time in activities normally performed by clinical staff). Physician's time may  include the following activities when performed: Preparing to see the patient (e.g., pre-charting review of records, searching for previously ordered imaging, lab work, and nerve conduction tests) Review of prior analgesic pharmacotherapies. Reviewing PMP Interpreting ordered tests (e.g., lab work, imaging, nerve conduction tests) Performing post-procedure evaluations, including interpretation of diagnostic procedures Obtaining and/or reviewing separately obtained history Performing a medically appropriate examination and/or evaluation Counseling and educating the patient/family/caregiver Ordering medications, tests, or procedures Referring and communicating with other health care professionals (when not separately reported) Documenting clinical information in the electronic or other health record Independently interpreting results (not separately reported) and communicating results to the patient/ family/caregiver Care coordination (not separately reported)  Note by: Oswaldo Done, MD (TTS technology used. I apologize for any typographical errors that were not detected and corrected.) Date: 02/07/2023; Time: 5:18 PM

## 2023-02-07 ENCOUNTER — Ambulatory Visit (HOSPITAL_BASED_OUTPATIENT_CLINIC_OR_DEPARTMENT_OTHER): Payer: 59 | Admitting: Pain Medicine

## 2023-02-07 DIAGNOSIS — Z91199 Patient's noncompliance with other medical treatment and regimen due to unspecified reason: Secondary | ICD-10-CM

## 2023-02-07 DIAGNOSIS — G8929 Other chronic pain: Secondary | ICD-10-CM

## 2023-02-07 DIAGNOSIS — M899 Disorder of bone, unspecified: Secondary | ICD-10-CM

## 2023-02-07 DIAGNOSIS — Z79899 Other long term (current) drug therapy: Secondary | ICD-10-CM

## 2023-02-07 DIAGNOSIS — G894 Chronic pain syndrome: Secondary | ICD-10-CM

## 2023-02-07 DIAGNOSIS — Z789 Other specified health status: Secondary | ICD-10-CM

## 2023-02-17 ENCOUNTER — Telehealth: Payer: Self-pay

## 2023-03-04 ENCOUNTER — Telehealth: Payer: Self-pay

## 2023-06-01 ENCOUNTER — Other Ambulatory Visit
Admission: RE | Admit: 2023-06-01 | Discharge: 2023-06-01 | Disposition: A | Discharge status: Home / Self Care | Payer: 59 | Source: Ambulatory Visit | Attending: Nurse Practitioner | Admitting: Nurse Practitioner

## 2023-06-01 DIAGNOSIS — R0602 Shortness of breath: Secondary | ICD-10-CM | POA: Diagnosis present

## 2023-06-01 LAB — BRAIN NATRIURETIC PEPTIDE: B Natriuretic Peptide: 53.2 pg/mL (ref 0.0–100.0)

## 2023-06-10 ENCOUNTER — Ambulatory Visit: Admit: 2023-06-10 | Payer: 59 | Admitting: Gastroenterology

## 2023-06-10 SURGERY — COLONOSCOPY WITH PROPOFOL
Anesthesia: General

## 2023-06-22 ENCOUNTER — Other Ambulatory Visit: Payer: Self-pay | Admitting: Infectious Diseases

## 2023-06-22 DIAGNOSIS — J40 Bronchitis, not specified as acute or chronic: Secondary | ICD-10-CM

## 2023-06-22 DIAGNOSIS — J22 Unspecified acute lower respiratory infection: Secondary | ICD-10-CM

## 2023-06-28 ENCOUNTER — Ambulatory Visit
Admission: RE | Admit: 2023-06-28 | Discharge: 2023-06-28 | Disposition: A | Discharge status: Home / Self Care | Payer: 59 | Source: Ambulatory Visit | Attending: Infectious Diseases | Admitting: Infectious Diseases

## 2023-06-28 DIAGNOSIS — J40 Bronchitis, not specified as acute or chronic: Secondary | ICD-10-CM | POA: Diagnosis present

## 2023-06-28 DIAGNOSIS — J22 Unspecified acute lower respiratory infection: Secondary | ICD-10-CM | POA: Diagnosis present

## 2023-07-28 ENCOUNTER — Other Ambulatory Visit: Payer: Self-pay | Admitting: Nurse Practitioner

## 2023-07-28 ENCOUNTER — Other Ambulatory Visit
Admission: RE | Admit: 2023-07-28 | Discharge: 2023-07-28 | Disposition: A | Discharge status: Home / Self Care | Source: Ambulatory Visit | Attending: Nurse Practitioner | Admitting: Nurse Practitioner

## 2023-07-28 DIAGNOSIS — I422 Other hypertrophic cardiomyopathy: Secondary | ICD-10-CM | POA: Insufficient documentation

## 2023-07-28 LAB — BRAIN NATRIURETIC PEPTIDE: B Natriuretic Peptide: 24.4 pg/mL (ref 0.0–100.0)

## 2023-07-29 ENCOUNTER — Other Ambulatory Visit: Payer: Self-pay | Admitting: Nurse Practitioner

## 2023-07-29 DIAGNOSIS — I2510 Atherosclerotic heart disease of native coronary artery without angina pectoris: Secondary | ICD-10-CM

## 2023-07-29 DIAGNOSIS — I10 Essential (primary) hypertension: Secondary | ICD-10-CM

## 2023-07-29 DIAGNOSIS — R0602 Shortness of breath: Secondary | ICD-10-CM

## 2023-07-29 DIAGNOSIS — I700 Atherosclerosis of aorta: Secondary | ICD-10-CM

## 2023-08-10 ENCOUNTER — Telehealth (HOSPITAL_COMMUNITY): Payer: Self-pay | Admitting: *Deleted

## 2023-08-10 NOTE — Telephone Encounter (Signed)
 Attempted to call patient regarding upcoming cardiac CT appointment. Left message on voicemail with name and callback number Johney Frame RN Navigator Cardiac Imaging [facility] [phone] Office

## 2023-08-11 ENCOUNTER — Ambulatory Visit
Admission: RE | Admit: 2023-08-11 | Discharge: 2023-08-11 | Disposition: A | Discharge status: Home / Self Care | Source: Ambulatory Visit | Attending: Nurse Practitioner | Admitting: Nurse Practitioner

## 2023-08-11 DIAGNOSIS — I10 Essential (primary) hypertension: Secondary | ICD-10-CM | POA: Insufficient documentation

## 2023-08-11 DIAGNOSIS — R9430 Abnormal result of cardiovascular function study, unspecified: Secondary | ICD-10-CM

## 2023-08-11 DIAGNOSIS — I2510 Atherosclerotic heart disease of native coronary artery without angina pectoris: Secondary | ICD-10-CM | POA: Insufficient documentation

## 2023-08-11 DIAGNOSIS — R0602 Shortness of breath: Secondary | ICD-10-CM | POA: Diagnosis present

## 2023-08-11 DIAGNOSIS — I700 Atherosclerosis of aorta: Secondary | ICD-10-CM | POA: Diagnosis present

## 2023-08-11 MED ORDER — IOHEXOL 350 MG/ML SOLN
75.0000 mL | Freq: Once | INTRAVENOUS | Status: AC | PRN
  Administered 2023-08-11: 75 mL via INTRAVENOUS

## 2023-08-11 MED ORDER — NITROGLYCERIN 0.4 MG SL SUBL
0.8000 mg | SUBLINGUAL_TABLET | Freq: Once | SUBLINGUAL | Status: AC
  Administered 2023-08-11: 0.8 mg via SUBLINGUAL

## 2023-08-11 MED ORDER — SODIUM CHLORIDE 0.9 % IV SOLN: INTRAVENOUS | Status: DC

## 2023-08-11 NOTE — Progress Notes (Signed)
 Patient tolerated procedure well. Ambulate w/o difficulty. Denies light headedness or being dizzy. Sitting up drinking water provided. Encouraged to drink extra water today and reasoning explained. Verbalized understanding. All questions answered. ABC intact. No further needs. Discharge from procedure area w/o issues.

## 2023-10-10 ENCOUNTER — Encounter (HOSPITAL_COMMUNITY): Payer: Self-pay

## 2023-10-12 ENCOUNTER — Ambulatory Visit

## 2023-12-08 NOTE — Progress Notes (Signed)
 Cardiac MRI Discharge: Patient completed CMRI successfully with no signs or symptoms of distress. PIV successfully removed intact, site dressed per hospital protocol.  Patient advised to keep dressing in place for 30 minutes. Patient's status stable, appropriate to baseline, and able to dress independently.  Patient walked to lobby by CMRI staff.   Discharge to: Yes  Home/Outpatient Facility  No Imaging agent adverse reaction noted  Jesse Diaz personal belongings were returned to the patient.

## 2024-01-10 ENCOUNTER — Emergency Department (HOSPITAL_COMMUNITY)

## 2024-01-10 ENCOUNTER — Encounter (HOSPITAL_COMMUNITY): Payer: Self-pay | Admitting: Emergency Medicine

## 2024-01-10 ENCOUNTER — Emergency Department (HOSPITAL_COMMUNITY)
Admission: EM | Admit: 2024-01-10 | Discharge: 2024-01-10 | Disposition: A | Discharge status: Home / Self Care | Attending: Emergency Medicine | Admitting: Emergency Medicine

## 2024-01-10 DIAGNOSIS — I10 Essential (primary) hypertension: Secondary | ICD-10-CM | POA: Insufficient documentation

## 2024-01-10 DIAGNOSIS — Z79899 Other long term (current) drug therapy: Secondary | ICD-10-CM | POA: Insufficient documentation

## 2024-01-10 DIAGNOSIS — Z87891 Personal history of nicotine dependence: Secondary | ICD-10-CM | POA: Diagnosis not present

## 2024-01-10 DIAGNOSIS — J441 Chronic obstructive pulmonary disease with (acute) exacerbation: Secondary | ICD-10-CM | POA: Insufficient documentation

## 2024-01-10 DIAGNOSIS — R0602 Shortness of breath: Secondary | ICD-10-CM | POA: Diagnosis present

## 2024-01-10 LAB — CBC WITH DIFFERENTIAL/PLATELET
Abs Immature Granulocytes: 0.03 K/uL (ref 0.00–0.07)
Basophils Absolute: 0.1 K/uL (ref 0.0–0.1)
Basophils Relative: 1 %
Eosinophils Absolute: 0.7 K/uL — ABNORMAL HIGH (ref 0.0–0.5)
Eosinophils Relative: 7 %
HCT: 43.4 % (ref 39.0–52.0)
Hemoglobin: 15.2 g/dL (ref 13.0–17.0)
Immature Granulocytes: 0 %
Lymphocytes Relative: 10 %
Lymphs Abs: 1.1 K/uL (ref 0.7–4.0)
MCH: 31.2 pg (ref 26.0–34.0)
MCHC: 35 g/dL (ref 30.0–36.0)
MCV: 89.1 fL (ref 80.0–100.0)
Monocytes Absolute: 0.5 K/uL (ref 0.1–1.0)
Monocytes Relative: 5 %
Neutro Abs: 8.5 K/uL — ABNORMAL HIGH (ref 1.7–7.7)
Neutrophils Relative %: 77 %
Platelets: 263 K/uL (ref 150–400)
RBC: 4.87 MIL/uL (ref 4.22–5.81)
RDW: 13.7 % (ref 11.5–15.5)
WBC: 10.9 K/uL — ABNORMAL HIGH (ref 4.0–10.5)
nRBC: 0 % (ref 0.0–0.2)

## 2024-01-10 LAB — TROPONIN I (HIGH SENSITIVITY)
Troponin I (High Sensitivity): 9 ng/L (ref <18)
Troponin I (High Sensitivity): 9 ng/L (ref <18)

## 2024-01-10 LAB — COMPREHENSIVE METABOLIC PANEL WITH GFR
ALT: 22 U/L (ref 0–44)
AST: 20 U/L (ref 15–41)
Albumin: 3.7 g/dL (ref 3.5–5.0)
Alkaline Phosphatase: 86 U/L (ref 38–126)
Anion gap: 10 (ref 5–15)
BUN: 12 mg/dL (ref 8–23)
CO2: 23 mmol/L (ref 22–32)
Calcium: 8.9 mg/dL (ref 8.9–10.3)
Chloride: 105 mmol/L (ref 98–111)
Creatinine, Ser: 0.84 mg/dL (ref 0.61–1.24)
GFR, Estimated: >60 mL/min (ref >60)
Glucose, Bld: 136 mg/dL — ABNORMAL HIGH (ref 70–99)
Potassium: 3.5 mmol/L (ref 3.5–5.1)
Sodium: 138 mmol/L (ref 135–145)
Total Bilirubin: 0.6 mg/dL (ref 0.0–1.2)
Total Protein: 6.8 g/dL (ref 6.5–8.1)

## 2024-01-10 LAB — BRAIN NATRIURETIC PEPTIDE: B Natriuretic Peptide: 13 pg/mL (ref 0.0–100.0)

## 2024-01-10 MED ORDER — PREDNISONE 20 MG PO TABS: ORAL_TABLET | ORAL | 0 refills | Status: AC

## 2024-01-10 MED ORDER — ALBUTEROL SULFATE (2.5 MG/3ML) 0.083% IN NEBU
10.0000 mg | INHALATION_SOLUTION | RESPIRATORY_TRACT | Status: DC
Start: 2024-01-10 — End: 2024-01-10
  Administered 2024-01-10: 10 mg via RESPIRATORY_TRACT
  Filled 2024-01-10: qty 12

## 2024-01-10 NOTE — ED Provider Notes (Signed)
 Emergency Department Provider Note  TRIAGE NOTE: BIB EMS with c/o shortness of breath for about an hour.  Patient did 3 albuterol  treatments before EMS got there with no relief. EMS gave a Duoneb and 125 mg of solumedrol.  HISTORY  Chief Complaint Shortness of Breath   HPI Jesse Diaz is a 63 y.o. male with  a history of COPD, chronic bronchitis, and a cardiac aneurysm, who presents with worsening shortness of breath. The patient reports that his breathing difficulties have persisted since Christmas, with exacerbations over the past eight months, during which he has been intermittently on prednisone . He has not been on prednisone  since June. The current episode began after attending a [employer] conference in [place], where his breathing became significantly worse upon returning home around 10:30 PM. Despite using his nebulizer multiple times, his symptoms did not improve, prompting his daughter to call 911. He denies wearing oxygen regularly and reports no recent fever. He has a productive cough with occasional green sputum but often feels a scratchy sensation without expectoration. The patient also mentions back pain following an incident while assisting his daughter, which he attributes to a possible muscle strain, for which he is seeing a Land. He typically uses his nebulizer twice daily but has increased to three to four times daily recently. History was obtained from the patient and EMS.  PMH Past Medical History:  Diagnosis Date   Anxiety    Asthma    Depression    GERD (gastroesophageal reflux disease)    in past   Hypertension    Sinus drainage    Wears dentures    partial lower    Home Medications Prior to Admission medications   Medication Sig Start Date End Date Taking? Authorizing Provider  predniSONE  (DELTASONE ) 20 MG tablet 3 tabs po day one, then 2 po daily x 4 days 01/10/24  Yes Mayo Owczarzak, Selinda, MD  albuterol  (VENTOLIN  HFA) 108 (90 Base) MCG/ACT inhaler  TAKE 2 PUFFS BY MOUTH EVERY 6 HOURS AS NEEDED FOR WHEEZE OR SHORTNESS OF BREATH 12/31/18   Stuart Vernell Norris, PA-C  CALCIUM CITRATE PO Take 750 mg by mouth daily.    [provider]  co-enzyme Q-10 30 MG capsule Take 30 mg by mouth daily.    [provider]  HYDROcodone -acetaminophen  (NORCO) 5-325 MG tablet Take 1 tablet by mouth every 6 (six) hours as needed for moderate pain. 01/11/23   Charlene Debby BROCKS, PA-C  lamoTRIgine  (LAMICTAL ) 100 MG tablet Take 100 mg by mouth 2 (two) times daily.    [provider]  losartan -hydrochlorothiazide  (HYZAAR) 100-12.5 MG tablet Take 1 tablet by mouth daily. 11/03/22   [provider]  Magnesium 250 MG TABS Take 250 mg by mouth 2 (two) times daily.    [provider]  Potassium 99 MG TABS Take 99 mg by mouth daily.    [provider]  TURMERIC PO Take by mouth.    [provider]  UNABLE TO FIND Super Oxicell    [provider]    Social History Social History   Tobacco Use   Smoking status: Never   Smokeless tobacco: Former    Quit date: 05/03/2006   Tobacco comments:    quit chew - approx 2008  Vaping Use   Vaping status: Never Used  Substance Use Topics   Alcohol use: No   Drug use: No    Review of Systems: Documented in HPI ____________________________________________  PHYSICAL EXAM: VITAL SIGNS: Triage: Blood pressure ROLLEN)  191/82, pulse 80, temperature 98.7 F (37.1 C), temperature source Oral, resp. rate 18, height 5' 8 (1.727 m), weight 95.3 kg, SpO2 91%.  Vitals:   01/10/24 0245 01/10/24 0300 01/10/24 0316 01/10/24 0317  BP: (!) 156/98 (!) 159/92 (!) 191/82   Pulse: 81 70 68 80  Resp: 18     Temp:      TempSrc:      SpO2: 90% 94%  91%  Weight:      Height:        Physical Exam Vitals and nursing note reviewed.  Constitutional:      Appearance: He is well-developed.  HENT:     Head: Normocephalic and atraumatic.  Cardiovascular:     Rate and Rhythm:  Normal rate.  Pulmonary:     Effort: Pulmonary effort is normal. Tachypnea present. No respiratory distress.     Breath sounds: Decreased breath sounds and wheezing present.  Abdominal:     General: There is no distension.  Musculoskeletal:        General: Normal range of motion.     Cervical back: Normal range of motion.  Neurological:     Mental Status: He is alert.       ____________________________________________   LABS (all labs ordered are listed, but only abnormal results are displayed)  Labs Reviewed  CBC WITH DIFFERENTIAL/PLATELET - Abnormal; Notable for the following components:      Result Value   WBC 10.9 (*)    Neutro Abs 8.5 (*)    Eosinophils Absolute 0.7 (*)    All other components within normal limits  COMPREHENSIVE METABOLIC PANEL WITH GFR - Abnormal; Notable for the following components:   Glucose, Bld 136 (*)    All other components within normal limits  BRAIN NATRIURETIC PEPTIDE  TROPONIN I (HIGH SENSITIVITY)  TROPONIN I (HIGH SENSITIVITY)   ____________________________________________  EKG   EKG Interpretation Date/Time:    Ventricular Rate:    PR Interval:    QRS Duration:    QT Interval:    QTC Calculation:   R Axis:      Text Interpretation:          ____________________________________________  RADIOLOGY  DG Chest Portable 1 View Result Date: 01/10/2024 EXAM: 1 VIEW XRAY OF THE CHEST 01/10/2024 01:57:00 AM COMPARISON: 06/16/20. CLINICAL HISTORY: Eval for cough/sob. Eval for cough/shob, elevated BP @ time of imaging (159/94), taking breathing treatment during imaging. FINDINGS: LUNGS AND PLEURA: No focal pulmonary opacity. No pulmonary edema. No pleural effusion. No pneumothorax. HEART AND MEDIASTINUM: No acute abnormality of the cardiac and mediastinal silhouettes. BONES AND SOFT TISSUES: No acute osseous abnormality. IMPRESSION: 1. No acute process. Electronically signed by: Norman Gatlin MD 01/10/2024 02:01 AM EDT RP Workstation:  [id number]   ____________________________________________  PROCEDURES  Procedure(s) performed:   .Critical Care  Performed by: Lorette Mayo, MD Authorized by: Lorette Mayo, MD   Critical care provider statement:    Critical care time (minutes):  30   Critical care was necessary to treat or prevent imminent or life-threatening deterioration of the following conditions:  Respiratory failure   Critical care was time spent personally by me on the following activities:  Development of treatment plan with patient or surrogate, discussions with consultants, evaluation of patient's response to treatment, examination of patient, ordering and review of laboratory studies, ordering and review of radiographic studies, ordering and performing treatments and interventions, pulse oximetry, re-evaluation of patient's condition and review of old charts  ____________________________________________  INITIAL IMPRESSION / ASSESSMENT  AND PLAN   The patient arrived at the ED with worsening shortness of breath after attending a [address] conference. The patient has a history of COPD and reports a past diagnosis of chronic bronchitis and an aneurysm in the heart. The patient has been experiencing breathing difficulties for the past eight months, with frequent use of prednisone , although not recently. The patient attempted multiple nebulizer treatments at home without relief before calling 911. The patient denies recent fever and reports occasional green sputum. There is no significant swelling in the legs or belly. The patient also reports back pain after assisting a family member, for which they are seeing a chiropractor. The patient typically uses a nebulizer three to four times a day.  Initial DDx:      COPD exacerbation, heart failure exacerbation, pneumonia, bronchitis, asthma exacerbation, and pulmonary embolism.  ED Course   Patient received continuous albuterol  for an hour.  Chest x-ray negative for  any consolidation to suggest pneumonia.  No evidence of fluid on there and his BNP was reassuring making congestive heart failure unlikely even though he is hypertensive.  He will need to follow-up his hypertension with his PCP.      Images ordered viewed and obtained by myself. Agree with Radiology interpretation. Details in ED course.  Labs ordered reviewed by myself as detailed in ED course.  Consultations obtained/considered detailed in ED course.    CRITICAL INTERVENTIONS:  Albuterol  oxygen  Initially thought the patient would likely need to be admitted secondary to his work of breathing however after multiple treatments patient returned to baseline.  He is able to ambulate and have conversation while ambulating without hypoxia.  His tachypnea was at baseline per patient report.  Even while sleeping his oxygen saturation was 89 and above and when he was awake and talking it was 93-95.  Patient already has pulmonology and is post be starting a new medicine here shortly and has albuterol  and all necessary treatments at home.  Will put him on a steroid burst.  No indication for antibiotics.  Low suspicion for PE with his improvement with albuterol .  Lungs were significantly more open with much less wheezing at time of discharge.   FINAL IMPRESSION Final diagnoses:  COPD exacerbation (HCC)  Hypertension, unspecified type     Disposition  A medical screening exam was performed and I feel the patient has had an appropriate workup for their chief complaint at this time and likelihood of emergent condition existing is low. They have been counseled on decision, DISCHARGE, follow up and which symptoms necessitate immediate return to the emergency department. They or their family verbally stated understanding and agreement with plan and discharged in stable condition.   ____________________________________________   NEW OUTPATIENT MEDICATIONS STARTED DURING THIS VISIT:  Discharge Medication  List as of 01/10/2024  3:41 AM     START taking these medications   Details  predniSONE  (DELTASONE ) 20 MG tablet 3 tabs po day one, then 2 po daily x 4 days, Normal        Note:  This note was prepared with assistance of Dragon voice recognition software. Occasional wrong-word or sound-a-like substitutions may have occurred due to the inherent limitations of voice recognition software.    Ritisha Deitrick, Selinda, MD 01/10/24 [phone]

## 2024-01-10 NOTE — ED Notes (Signed)
 Pt ambulated down the hall while having O2 monitored. Pt stated he felt better after the breathing treatment. O2 held low 90s not dropping below 90. Pt was able to have a conversation while ambulating.

## 2024-01-10 NOTE — ED Triage Notes (Signed)
 BIB EMS with c/o shortness of breath for about an hour.  Patient did 3 albuterol  treatments before EMS got there with no relief. EMS gave a Duoneb and 125 mg of solumedrol.
